# Patient Record
Sex: Male | Born: 1990 | Race: Black or African American | Hispanic: No | Marital: Single | Smoking: Current some day smoker
Health system: Southern US, Community
[De-identification: ages and names within clinical notes are randomized; demographics above are authoritative.]

---

## 2012-11-12 ENCOUNTER — Other Ambulatory Visit: Payer: Self-pay

## 2012-11-12 ENCOUNTER — Emergency Department (HOSPITAL_COMMUNITY): Payer: BC Managed Care – PPO

## 2012-11-12 ENCOUNTER — Emergency Department (HOSPITAL_COMMUNITY)
Admission: EM | Admit: 2012-11-12 | Discharge: 2012-11-13 | Disposition: A | Discharge status: Home / Self Care | Payer: BC Managed Care – PPO | Attending: Emergency Medicine | Admitting: Emergency Medicine

## 2012-11-12 DIAGNOSIS — I10 Essential (primary) hypertension: Secondary | ICD-10-CM | POA: Insufficient documentation

## 2012-11-12 DIAGNOSIS — Z792 Long term (current) use of antibiotics: Secondary | ICD-10-CM | POA: Insufficient documentation

## 2012-11-12 DIAGNOSIS — R42 Dizziness and giddiness: Secondary | ICD-10-CM | POA: Insufficient documentation

## 2012-11-12 DIAGNOSIS — R319 Hematuria, unspecified: Secondary | ICD-10-CM | POA: Insufficient documentation

## 2012-11-12 LAB — BASIC METABOLIC PANEL
BUN: 10 mg/dL (ref 6–23)
Calcium: 9 mg/dL (ref 8.4–10.5)
Chloride: 104 mEq/L (ref 96–112)
Creatinine, Ser: 1.05 mg/dL (ref 0.50–1.35)
GFR calc Af Amer: >90 mL/min (ref >90)

## 2012-11-12 LAB — CBC
HCT: 37.7 % — ABNORMAL LOW (ref 39.0–52.0)
MCH: 30 pg (ref 26.0–34.0)
MCV: 86.9 fL (ref 78.0–100.0)
RDW: 13.5 % (ref 11.5–15.5)
WBC: 5.7 10*3/uL (ref 4.0–10.5)

## 2012-11-12 NOTE — ED Notes (Signed)
Per EMS: Pt reporting intermittent gradual onset of left sided CP with radiation to left arm/neck x 2 days. Pt recently dx with HTN 2 weeks ago. Per pt, HTN due to untreated strep throat infection per PCP. Pt denying CP at this time. 174/110. 100% RA. 66 NSR. Elevation noted V3, V4.

## 2012-11-12 NOTE — ED Notes (Signed)
Istat trop = 0.01.

## 2012-11-12 NOTE — ED Provider Notes (Signed)
CSN: [id number]     Arrival date & time 11/12/12  2146 History     First MD Initiated Contact with Patient 11/12/12 2148     Chief Complaint  Patient presents with  . Hypertension   (Consider location/radiation/quality/duration/timing/severity/associated sxs/prior Treatment) The history is provided by the patient and medical records. No language interpreter was used.     Sean Hutchinson is a 22 y.o. male  with a hx of post strep glomerulonephritis presents to the Emergency Department complaining of HTN measured 187/111 earlier in the evening with associated lightheadedness for about 1 hour, but admits that he was having a panic attack at the time.  Pt with complete resolution of symptoms by the time EMS arrived on scene. Pt states he had strep throat about 4 weeks ago for which he was never treated.  He then developed HTN and swelling in all extremities.  Evaluation of the symptoms by his primary care physician showed protein and blood in the urine as well as increased BUN and creatinine.  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 He is also seen a cardiologist for his resulting hypertension and was started on bisystolic several weeks ago. Patient states she's not been taking it regularly because he thought he didn't need it. Patient states he resumed his medication regimen 2 days ago but has not taken today's dose. Patient without associated symptoms at this time. Nothing makes it better or worse. He denies fever, chills, headache, neck pain chest pain shortness of breath, abdominal pain nausea, vomiting, diarrhea, weakness, dizziness, syncope, dysuria, hematuria.  PCP: Dr Tania Ade - in [facility]  No past medical history on file. No past surgical history on file. No family history on file. History  Substance Use Topics  . Smoking status: Not on file  . Smokeless tobacco: Not on file  . Alcohol Use: Not on file    Review of Systems  Constitutional: Negative for  fever, diaphoresis, appetite change, fatigue and unexpected weight change.  HENT: Negative for mouth sores and neck stiffness.   Eyes: Negative for visual disturbance.  Respiratory: Negative for cough, chest tightness, shortness of breath and wheezing.   Cardiovascular: Negative for chest pain.  Gastrointestinal: Negative for nausea, vomiting, abdominal pain, diarrhea and constipation.  Endocrine: Negative for polydipsia, polyphagia and polyuria.  Genitourinary: Negative for dysuria, urgency, frequency and hematuria.  Musculoskeletal: Negative for back pain.  Skin: Negative for rash.  Allergic/Immunologic: Negative for immunocompromised state.  Neurological: Positive for light-headedness. Negative for syncope and headaches.  Hematological: Does not bruise/bleed easily.  Psychiatric/Behavioral: Negative for sleep disturbance. The patient is not nervous/anxious.     Allergies  Review of patient's allergies indicates not on file.  Home Medications   Current Outpatient Rx  Name  Route  Sig  Dispense  Refill  . ciprofloxacin (CIPRO) 500 MG tablet   Oral   Take 500 mg by mouth once.          BP 163/98  Pulse 63  Temp(Src) 98.5 F (36.9 C) (Oral)  Resp 12  SpO2 100% Physical Exam  Nursing note and vitals reviewed. Constitutional: He is oriented to person, place, and time. He appears well-developed and well-nourished. No distress.  Awake, alert, nontoxic appearance  HENT:  Head: Normocephalic and atraumatic.  Right Ear: External ear normal.  Left Ear: External ear normal.  Mouth/Throat: Oropharynx is clear and moist. No oropharyngeal exudate.  Eyes: Conjunctivae and EOM are normal. Pupils are equal, round, and reactive to light. No  scleral icterus.  Neck: Normal range of motion. Neck supple.  Cardiovascular: Normal rate, regular rhythm, S1 normal, S2 normal, normal heart sounds and intact distal pulses.   No murmur heard. Pulses:      Radial pulses are 2+ on the right side,  and 2+ on the left side.       Dorsalis pedis pulses are 2+ on the right side, and 2+ on the left side.       Posterior tibial pulses are 2+ on the right side, and 2+ on the left side.  Capillary refill < 3 sec  Pulmonary/Chest: Effort normal and breath sounds normal. No respiratory distress. He has no wheezes. He has no rales. He exhibits no tenderness.  Abdominal: Soft. Bowel sounds are normal. He exhibits no distension and no mass. There is no tenderness. There is no rebound and no guarding.  No CVA tenderness  Musculoskeletal: Normal range of motion. He exhibits no edema and no tenderness.  Lymphadenopathy:    He has no cervical adenopathy.  Neurological: He is alert and oriented to person, place, and time. He exhibits normal muscle tone. Coordination normal.  Speech is clear and goal oriented Moves extremities without ataxia  Skin: Skin is warm and dry. No rash noted. He is not diaphoretic. No erythema.  Psychiatric: He has a normal mood and affect.    ED Course   Procedures (including critical care time)  Labs Reviewed  CBC - Abnormal; Notable for the following:    HCT 37.7 (*)    All other components within normal limits  URINALYSIS, ROUTINE W REFLEX MICROSCOPIC - Abnormal; Notable for the following:    Hgb urine dipstick SMALL (*)    All other components within normal limits  BASIC METABOLIC PANEL  URINE MICROSCOPIC-ADD ON   Date: 11/13/2012  Rate: 61  Rhythm: normal sinus rhythm  QRS Axis: right  Intervals: normal  ST/T Wave abnormalities: normal  Conduction Disutrbances:Incomplete right bundle-branch block  Narrative Interpretation: Incomplete right bundle-branch block. No prior ECG available for comparison.  Old EKG Reviewed: none available  Dg Chest 2 View  11/12/2012   *RADIOLOGY REPORT*  Clinical Data: Chest pain  CHEST - 2 VIEW  Comparison: None.  Findings: Cardiomediastinal silhouette is within normal limits. The lungs are clear. No pleural effusion.  No  pneumothorax.  No acute osseous abnormality.  IMPRESSION: Normal chest.   Original Report Authenticated By: Christiana Pellant, M.D.   1. HTN (hypertension)   2. Painless hematuria     MDM  Ronan Duecker presents for evaluation of asymptomatic HTN and anxiety.  Pt has been noncompliant with his medication for the last several weeks.  Patient noted to be hypertensive in the emergency department.  No signs of hypertensive urgency.  CBC, BMP unremarkable. Chest x-ray without evidence of pleural effusions or pneumothorax. Troponin negative. ECG is noted right bundle branch block no old for comparison but no acute ischemia noted. I highly doubt ACS patient has negative troponin is without chest pain, shortness of breath, diaphoresis or other associated symptoms. UA without routine but with small amount of hemoglobin. Discussed with patient the need for close follow-up and management by their primary care physician.   I have also discussed reasons to return immediately to the ER.  Patient expresses understanding and agrees with plan.     Dahlia Client Jago Carton, PA-C 11/13/12 [phone]

## 2012-11-12 NOTE — ED Notes (Signed)
Pt states he had strep throat 4 weeks ago and was not treated for it. Pt states he has been seeing his primary care doctor and a cardiologist as his BP has been high since he noticed he had strep throat and did not treat it. Pt states his doctor is concerned with his kidney function and his high BP. Pt states he felt lightheaded and dizzy earlier today while experiencing chest pain. Pt states he called EMS at this time.

## 2012-11-13 LAB — URINALYSIS, ROUTINE W REFLEX MICROSCOPIC
Bilirubin Urine: NEGATIVE
Glucose, UA: NEGATIVE mg/dL
Ketones, ur: NEGATIVE mg/dL
Nitrite: NEGATIVE
Protein, ur: NEGATIVE mg/dL
pH: 7 (ref 5.0–8.0)

## 2012-11-13 LAB — POCT I-STAT TROPONIN I: Troponin i, poc: 0.01 ng/mL (ref 0.00–0.08)

## 2012-11-13 LAB — URINE MICROSCOPIC-ADD ON

## 2012-11-13 NOTE — ED Provider Notes (Signed)
Date: 11/13/2012  Rate: 61  Rhythm: normal sinus rhythm  QRS Axis: right  Intervals: normal  ST/T Wave abnormalities: normal  Conduction Disutrbances:Incomplete right bundle-branch block  Narrative Interpretation: Right axis deviation, incomplete right bundle-branch block. No prior ECG available for comparison.  Old EKG Reviewed: none available  Medical screening examination/treatment/procedure(s) were performed by non-physician practitioner and as supervising physician I was immediately available for consultation/collaboration.   Dione Booze, MD 11/13/12 [phone]

## 2012-11-13 NOTE — ED Provider Notes (Signed)
Medical screening examination/treatment/procedure(s) were performed by non-physician practitioner and as supervising physician I was immediately available for consultation/collaboration.    Christopher J. Pollina, MD 11/13/12 1545 

## 2012-12-19 ENCOUNTER — Other Ambulatory Visit: Payer: Self-pay | Admitting: Nephrology

## 2012-12-24 ENCOUNTER — Other Ambulatory Visit: Payer: BC Managed Care – PPO

## 2012-12-26 ENCOUNTER — Other Ambulatory Visit: Payer: BC Managed Care – PPO

## 2013-01-03 ENCOUNTER — Ambulatory Visit
Admission: RE | Admit: 2013-01-03 | Discharge: 2013-01-03 | Disposition: A | Discharge status: Home / Self Care | Payer: BC Managed Care – PPO | Source: Ambulatory Visit | Attending: Nephrology | Admitting: Nephrology

## 2014-01-29 IMAGING — CR DG CHEST 2V
2 series · 2 of 2 positions shown · non-contrast
Comparison: None.

CLINICAL DATA: Chest pain

CHEST - 2 VIEW

[w chest pa]
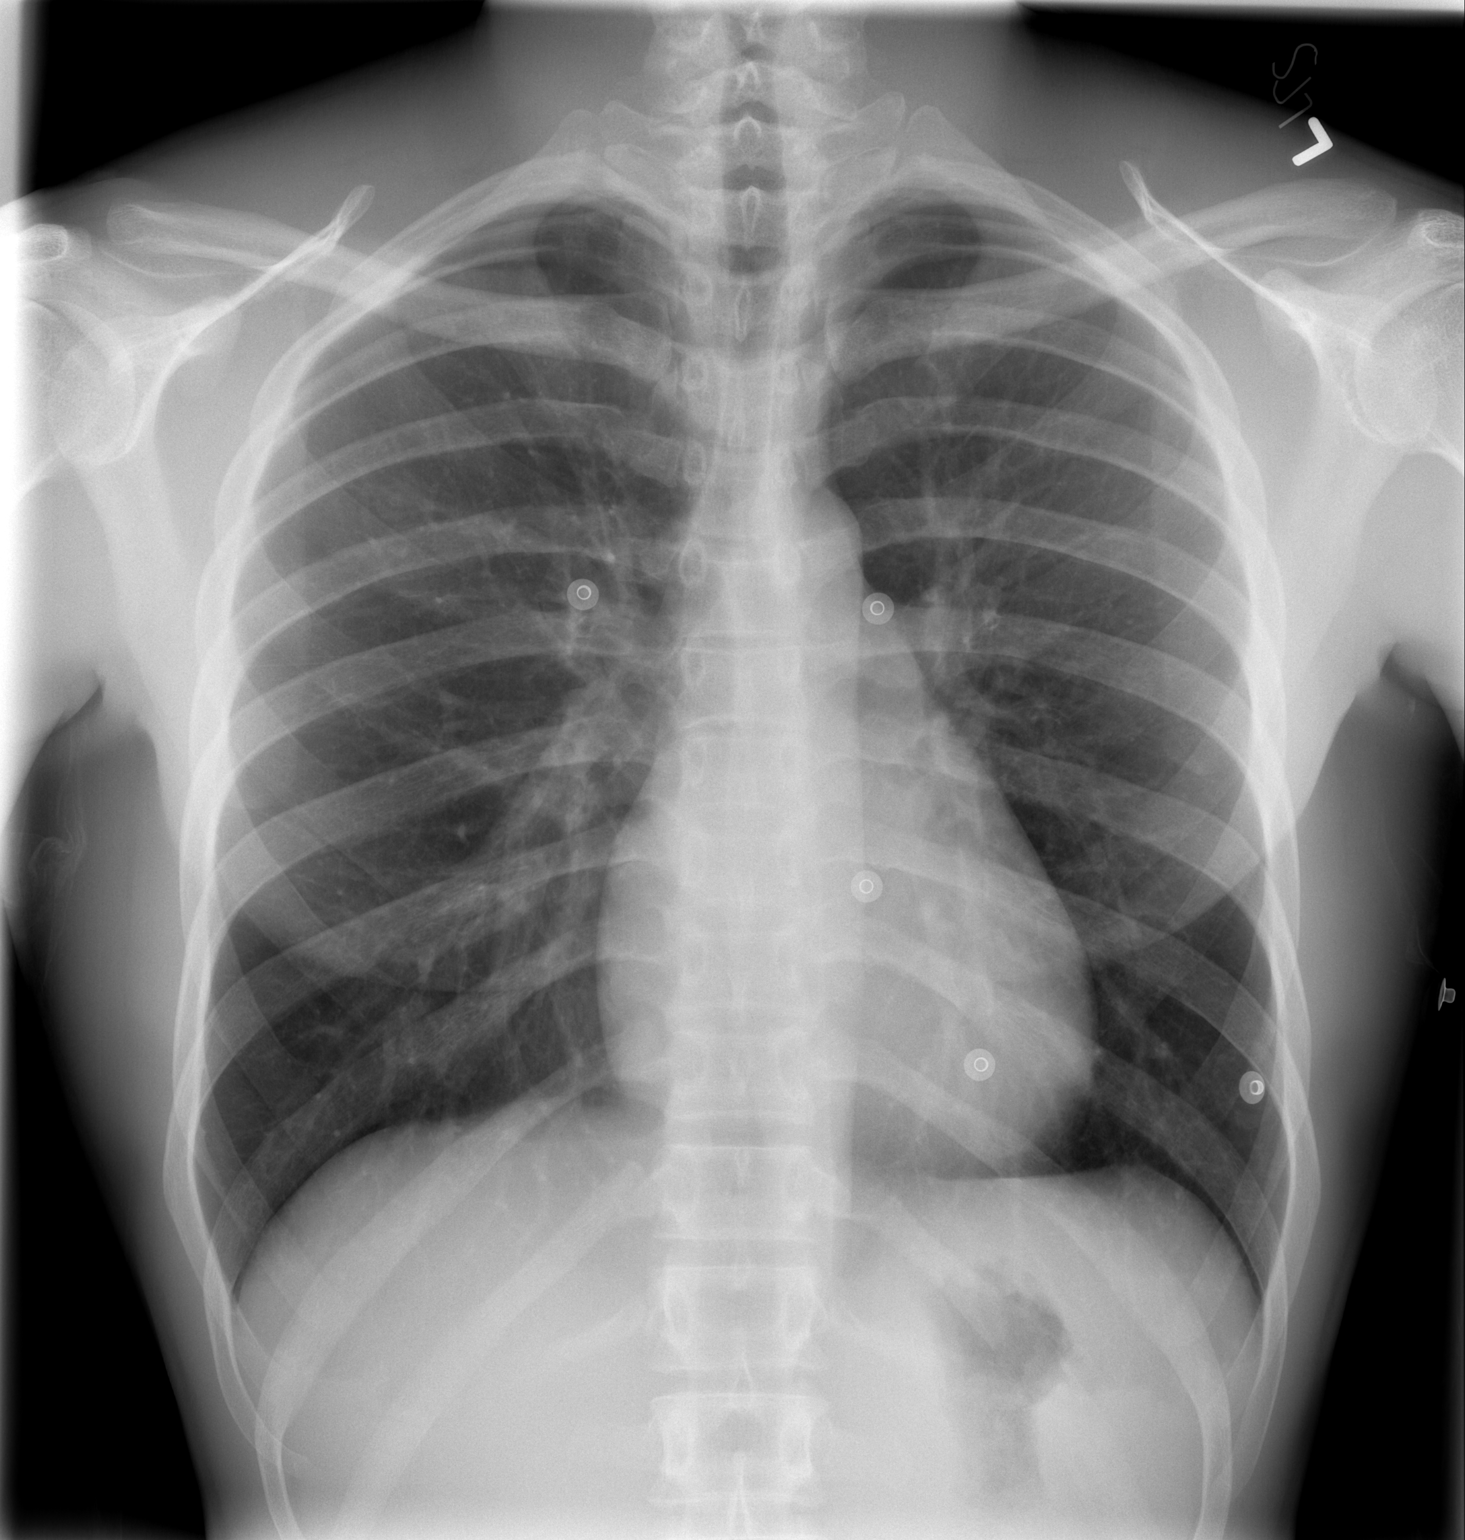

[w chest lat]
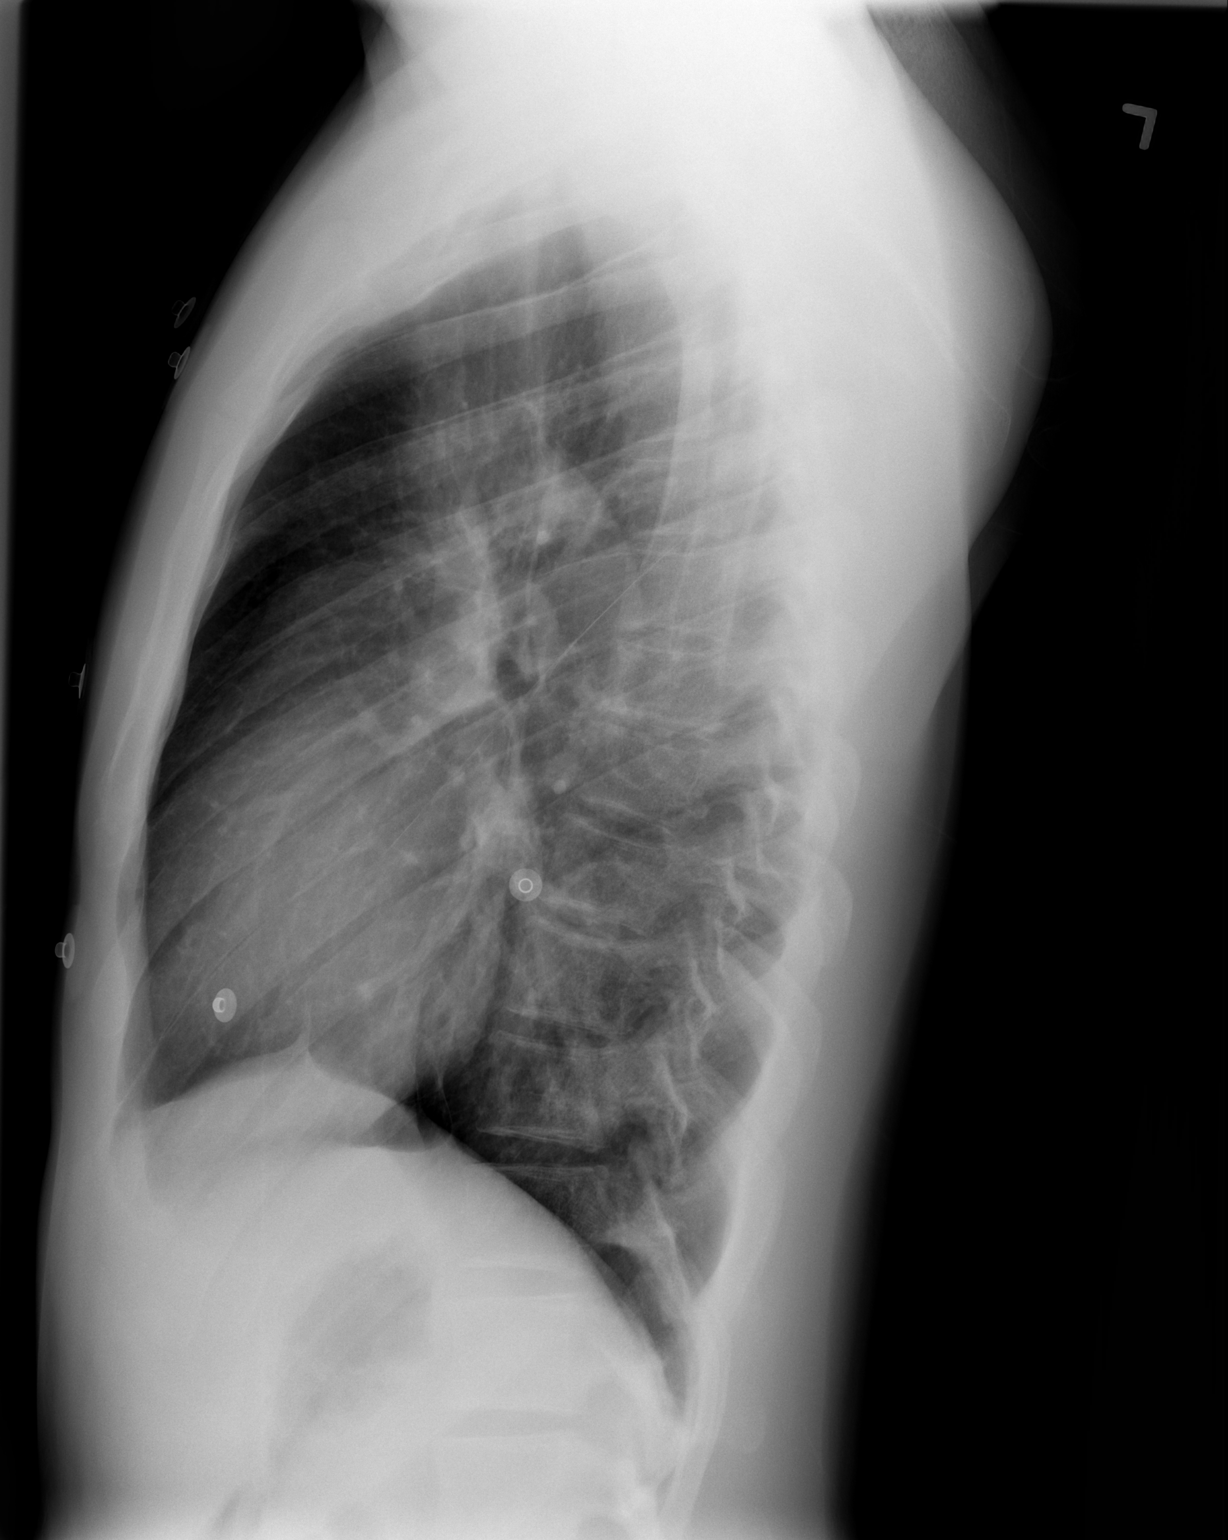

[2 of 2 positions shown; findings below may reference images not displayed]

FINDINGS: Cardiomediastinal silhouette is within normal limits. The
lungs are clear. No pleural effusion.  No pneumothorax.  No acute
osseous abnormality.
IMPRESSION: Normal chest.

## 2014-03-22 IMAGING — US US RENAL
1 series · 14 of 25 positions shown · non-contrast
Comparison: None.

CLINICAL DATA: Chronic kidney disease.

RENAL/URINARY TRACT ULTRASOUND COMPLETE

[Series 1: us renal · 0.24mm/px · 14 of 33 slices shown]
[im 1/33]
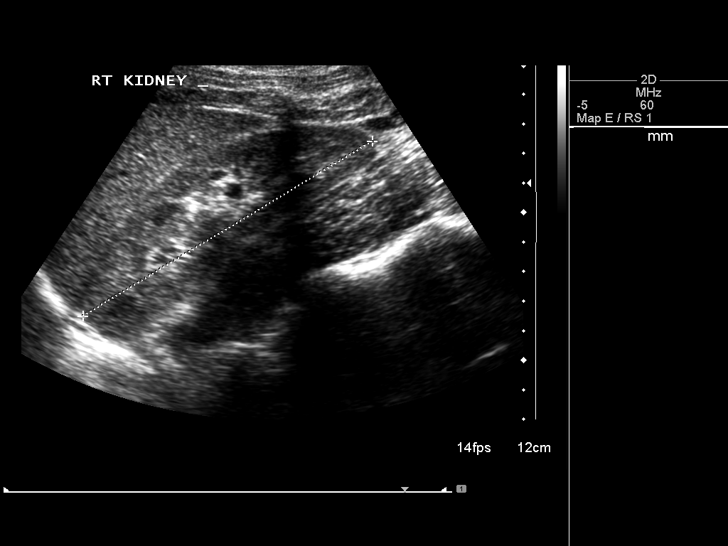
[im 3/33]
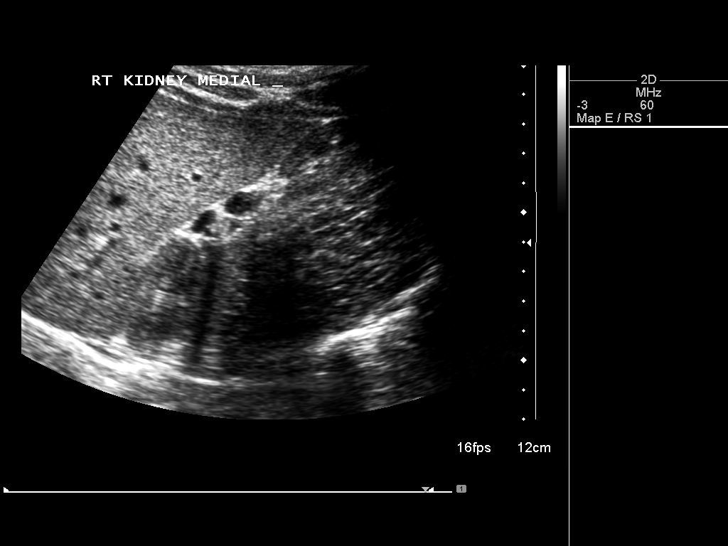
[im 6/33]
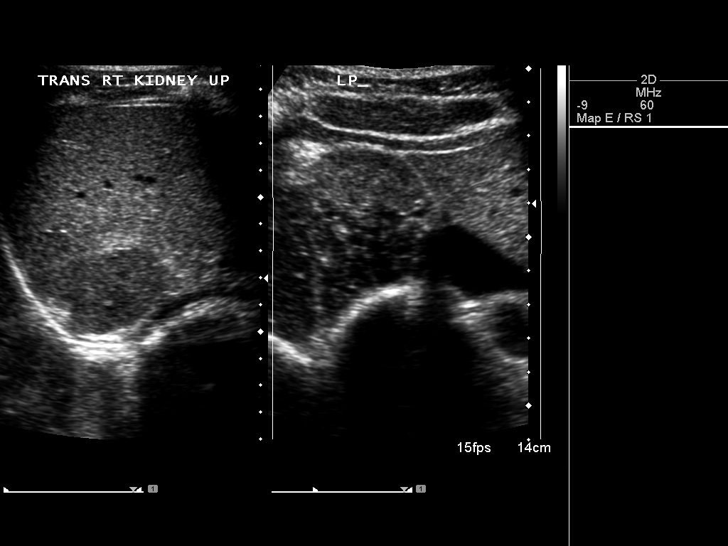
[im 9/33]
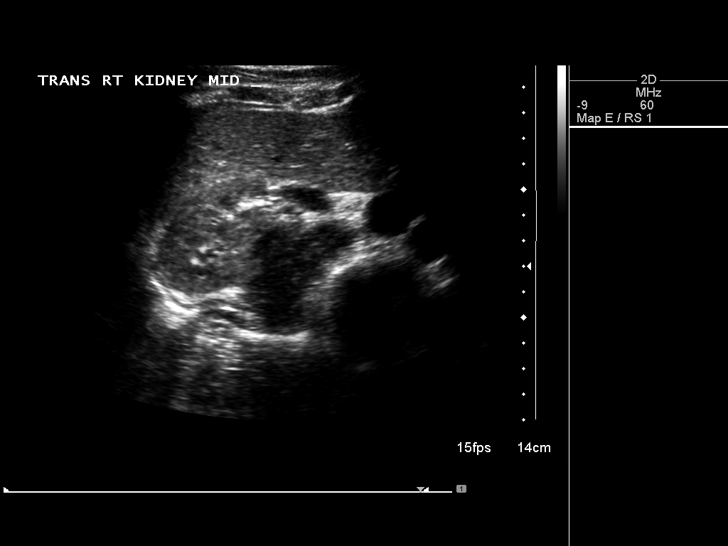
[im 11/33]
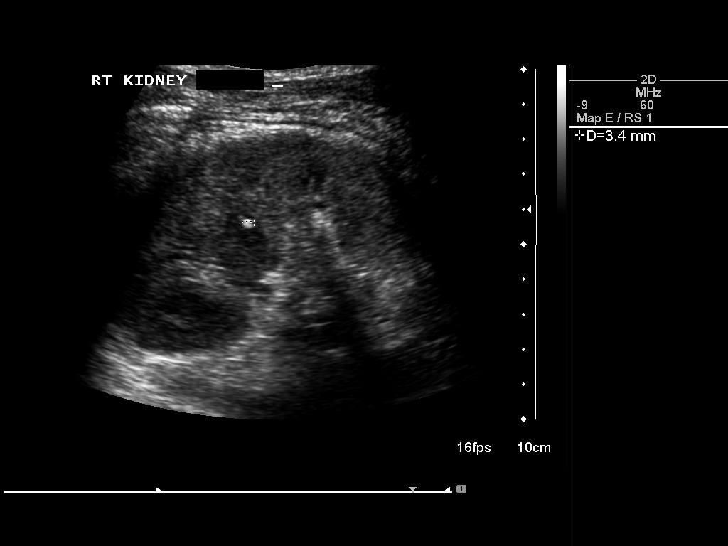
[im 13/33]
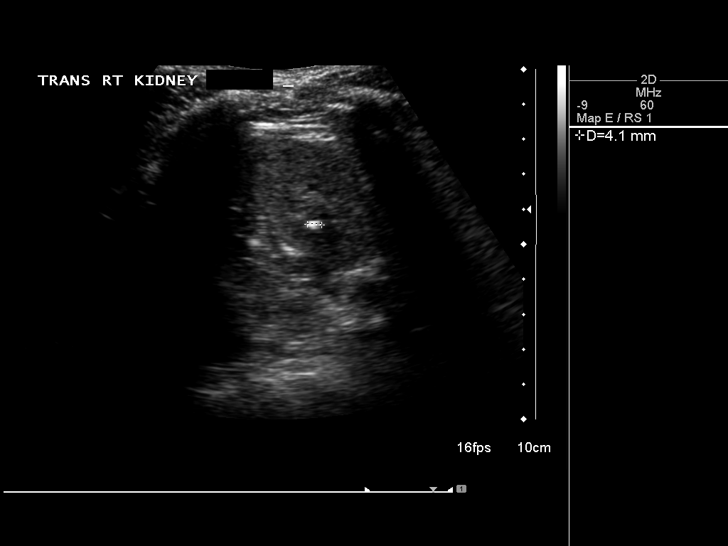
[im 15/33]
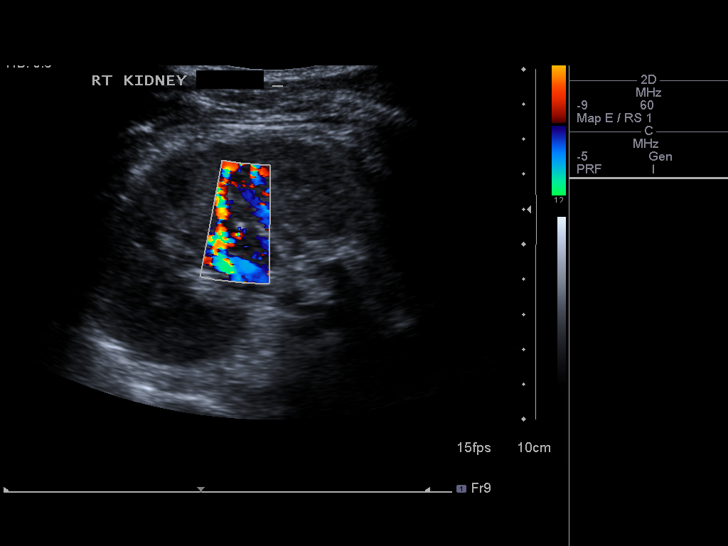
[im 18/33]
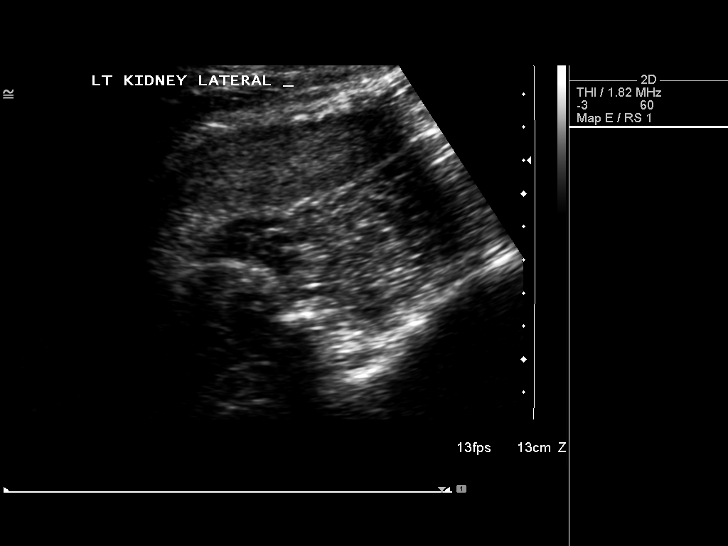
[im 21/33]
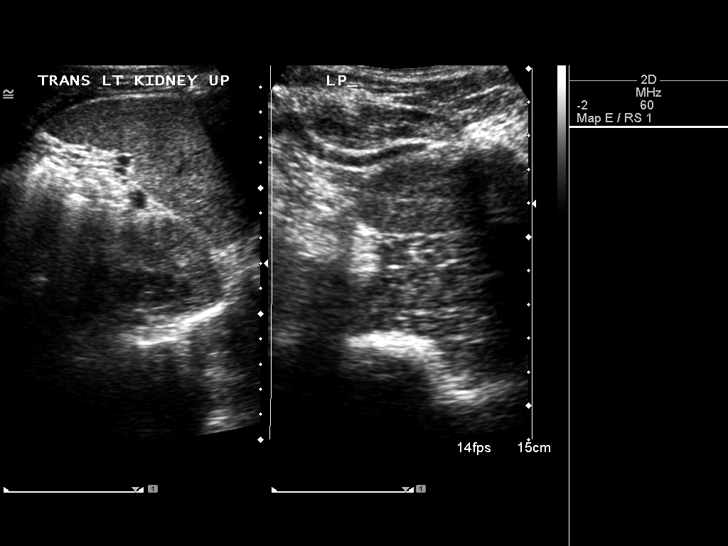
[im 22/33]
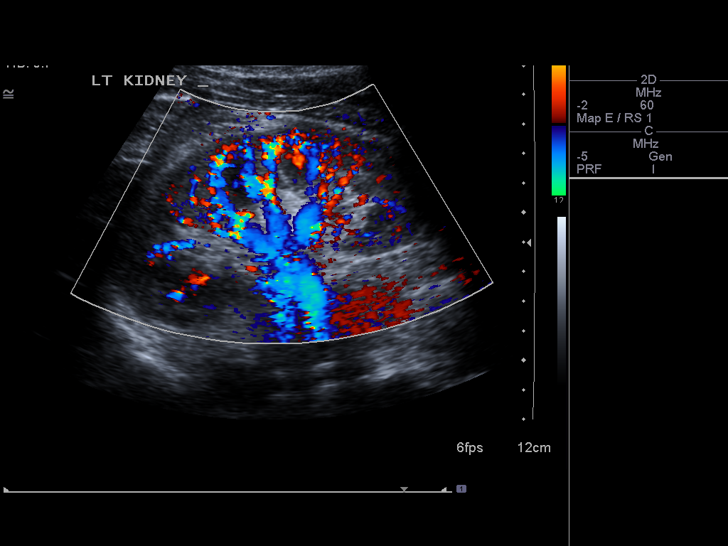
[im 25/33]
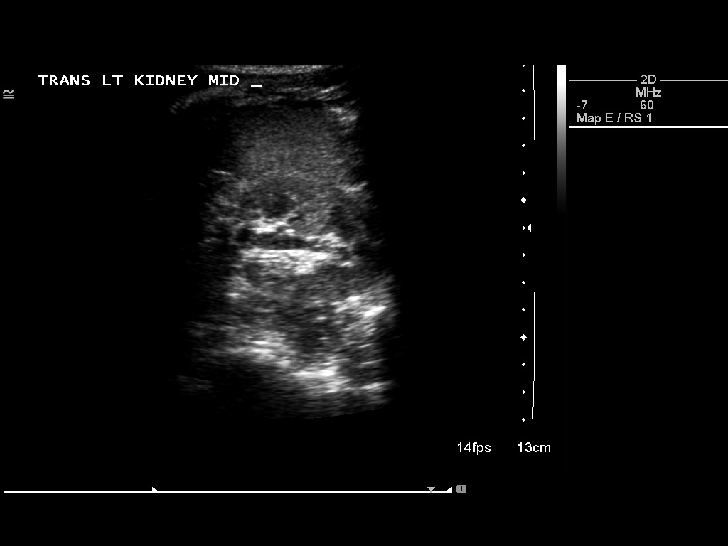
[im 27/33]
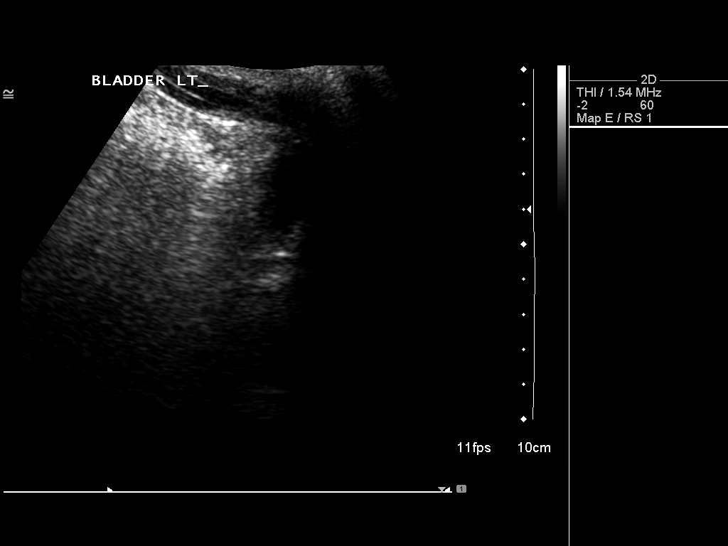
[im 30/33]
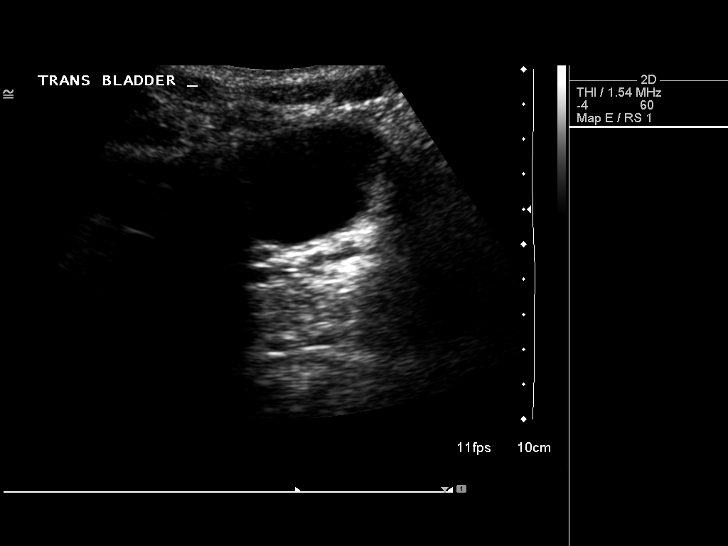
[im 33/33]
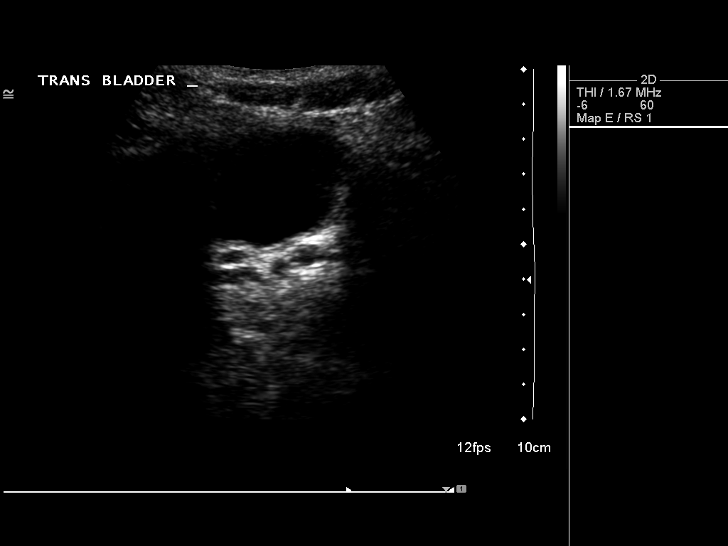

[14 of 25 positions shown; findings below may reference images not displayed]

FINDINGS: Right Kidney:  Measures 11.4 cm in length. 3 mm nonobstructive
calculus is seen in the mid to upper pole of right kidney. Normal
in size and parenchymal echogenicity.  No evidence of mass or
hydronephrosis.

Left Kidney:  Measures 11.7 cm in length. Normal in size and
parenchymal echogenicity.  No evidence of mass or hydronephrosis.

Bladder:  Appears normal for degree of bladder distention.
IMPRESSION: Small nonobstructive calculus seen in the right kidney.  No other
renal abnormality seen.

## 2015-03-28 ENCOUNTER — Encounter (HOSPITAL_COMMUNITY): Payer: Self-pay | Admitting: Emergency Medicine

## 2015-03-28 ENCOUNTER — Emergency Department (HOSPITAL_COMMUNITY)
Admission: EM | Admit: 2015-03-28 | Discharge: 2015-03-28 | Disposition: A | Discharge status: Home / Self Care | Payer: Self-pay | Attending: Emergency Medicine | Admitting: Emergency Medicine

## 2015-03-28 DIAGNOSIS — J028 Acute pharyngitis due to other specified organisms: Secondary | ICD-10-CM

## 2015-03-28 DIAGNOSIS — F17210 Nicotine dependence, cigarettes, uncomplicated: Secondary | ICD-10-CM | POA: Insufficient documentation

## 2015-03-28 DIAGNOSIS — J029 Acute pharyngitis, unspecified: Secondary | ICD-10-CM | POA: Insufficient documentation

## 2015-03-28 DIAGNOSIS — J309 Allergic rhinitis, unspecified: Secondary | ICD-10-CM | POA: Insufficient documentation

## 2015-03-28 DIAGNOSIS — B9789 Other viral agents as the cause of diseases classified elsewhere: Secondary | ICD-10-CM

## 2015-03-28 LAB — RAPID STREP SCREEN (MED CTR MEBANE ONLY): STREPTOCOCCUS, GROUP A SCREEN (DIRECT): NEGATIVE

## 2015-03-28 MED ORDER — FLUTICASONE PROPIONATE 50 MCG/ACT NA SUSP: 2.0000 | Freq: Every day | NASAL | Status: AC

## 2015-03-28 MED ORDER — NAPROXEN 250 MG PO TABS: 250.0000 mg | ORAL_TABLET | Freq: Two times a day (BID) | ORAL | Status: AC

## 2015-03-28 MED ORDER — CETIRIZINE HCL 10 MG PO TABS: 10.0000 mg | ORAL_TABLET | Freq: Every day | ORAL | Status: AC

## 2015-03-28 MED ORDER — ACETAMINOPHEN 325 MG PO TABS
650.0000 mg | ORAL_TABLET | Freq: Once | ORAL | Status: AC
  Administered 2015-03-28: 650 mg via ORAL
  Filled 2015-03-28: qty 2

## 2015-03-28 NOTE — ED Notes (Signed)
See pa note.  

## 2015-03-28 NOTE — ED Provider Notes (Signed)
CSN: [id number]     Arrival date & time 03/28/15  1954 History  By signing my name below, I, Sean Hutchinson, attest that this documentation has been prepared under the direction and in the presence of Sean FarrierWilliam Sarahanne Novakowski, PA-C. Electronically Signed: Evon Slackerrance Hutchinson, ED Scribe. 03/28/2015. 8:58 PM.    Chief Complaint  Patient presents with  . Sore Throat  . Nasal Congestion   Patient is a 24 y.o. male presenting with pharyngitis. The history is provided by the patient. No language interpreter was used.  Sore Throat Pertinent negatives include no abdominal pain, no headaches and no shortness of breath.   HPI Comments: Sean Hutchinson is a 24 y.o. male who presents to the Emergency Department complaining of sore throat onset 3 days prior. Pt rates the severity of his pain 5/10. Pt reports associated ear pain, rhinorrhea and post nasal drip. Pt states that he has tried cough drops with no relief. He reports similar pain with previous strep throat.  Denies fever, chills, coughing, wheezing, shortness of breath, sneezing, abdominal pain, nausea vomiting drooling or trouble swallowing   History reviewed. No pertinent past medical history. History reviewed. No pertinent past surgical history. No family history on file. Social History  Substance Use Topics  . Smoking status: Current Some Day Smoker    Types: Cigars  . Smokeless tobacco: None  . Alcohol Use: Yes    Review of Systems  Constitutional: Negative for fever and chills.  HENT: Positive for ear pain, postnasal drip, rhinorrhea and sore throat. Negative for drooling, sinus pressure, sneezing and trouble swallowing.   Eyes: Negative for pain, discharge, itching and visual disturbance.  Respiratory: Negative for cough, shortness of breath and wheezing.   Gastrointestinal: Negative for nausea, vomiting and abdominal pain.  Skin: Negative for rash.  Neurological: Negative for light-headedness and headaches.      Allergies  Review of  patient's allergies indicates no known allergies.  Home Medications   Prior to Admission medications   Medication Sig Start Date End Date Taking? Authorizing Provider  cetirizine (ZYRTEC ALLERGY) 10 MG tablet Take 1 tablet (10 mg total) by mouth daily. 03/28/15   Sean FarrierWilliam Esgar Barnick, PA-C  ciprofloxacin (CIPRO) 500 MG tablet Take 500 mg by mouth once. 11/07/12   Historical Provider, MD  fluticasone (FLONASE) 50 MCG/ACT nasal spray Place 2 sprays into both nostrils daily. 03/28/15   Sean FarrierWilliam Talton Delpriore, PA-C  naproxen (NAPROSYN) 250 MG tablet Take 1 tablet (250 mg total) by mouth 2 (two) times daily with a meal. 03/28/15   Sean FarrierWilliam Aniesha Haughn, PA-C   BP 144/81 mmHg  Pulse 66  Temp(Src) 97.7 F (36.5 C) (Oral)  Resp 16  Ht 6\' 1"  (1.854 m)  Wt 71.215 kg  BMI 20.72 kg/m2  SpO2 98%   Physical Exam  Constitutional: He is oriented to person, place, and time. He appears well-developed and well-nourished. No distress.  Nontoxic appearing.  HENT:  Head: Normocephalic and atraumatic.  Right Ear: Tympanic membrane and external ear normal.  Left Ear: Tympanic membrane and external ear normal.  Nose: Right sinus exhibits no maxillary sinus tenderness and no frontal sinus tenderness. Left sinus exhibits no maxillary sinus tenderness and no frontal sinus tenderness.  Mouth/Throat: Uvula is midline. No oropharyngeal exudate, posterior oropharyngeal edema or posterior oropharyngeal erythema.  Boggy nasal turbinates bilaterally. Tonsils surgically absent. No exudates. Uvula is midline without edema. No peritonsillar abscess. No trismus. No drooling. No frontal or maxillary sinus tenderness to palpation.  Bilateral tympanic membranes are pearly-gray without erythema or  loss of landmarks.   Eyes: Conjunctivae and EOM are normal. Pupils are equal, round, and reactive to light. Right eye exhibits no discharge. Left eye exhibits no discharge.  Neck: Normal range of motion. Neck supple. No JVD present. No tracheal  deviation present.  Cardiovascular: Normal rate, regular rhythm, normal heart sounds and intact distal pulses.   Pulmonary/Chest: Effort normal and breath sounds normal. No respiratory distress. He has no wheezes. He has no rales.  Clear to auscultation.   Abdominal: Soft. There is no tenderness.  Lymphadenopathy:    He has no cervical adenopathy.  Neurological: He is alert and oriented to person, place, and time. Coordination normal.  Skin: Skin is warm and dry. No rash noted. He is not diaphoretic. No erythema. No pallor.  Psychiatric: He has a normal mood and affect. His behavior is normal.  Nursing note and vitals reviewed.   ED Course  Procedures (including critical care time) DIAGNOSTIC STUDIES: Oxygen Saturation is 98% on RA, normal by my interpretation.    COORDINATION OF CARE: 8:03 PM-Discussed treatment plan with pt at bedside and pt agreed to plan.     Labs Review Labs Reviewed  RAPID STREP SCREEN (NOT AT [facility])  CULTURE, GROUP A STREP    Imaging Review No results found.    EKG Interpretation None      Filed Vitals:   03/28/15 2000 03/28/15 2002  BP: 144/81   Pulse: 66   Temp:  97.7 F (36.5 C)  TempSrc: Oral Oral  Resp: 16   Height:  (1.854 m)   Weight: 71.215 kg   SpO2: 98%      MDM   Meds given in ED:  Medications  acetaminophen (TYLENOL) tablet 650 mg (650 mg Oral Given 03/28/15 2055)    New Prescriptions   CETIRIZINE (ZYRTEC ALLERGY) 10 MG TABLET    Take 1 tablet (10 mg total) by mouth daily.   FLUTICASONE (FLONASE) 50 MCG/ACT NASAL SPRAY    Place 2 sprays into both nostrils daily.   NAPROXEN (NAPROSYN) 250 MG TABLET    Take 1 tablet (250 mg total) by mouth 2 (two) times daily with a meal.    Final diagnoses:  Allergic rhinitis, unspecified allergic rhinitis type  Sore throat (viral)    This is a 24 y.o. male who presents to the Emergency Department complaining of sore throat onset 3 days prior. Pt rates the severity of his pain  5/10. Pt reports associated ear pain, rhinorrhea and post nasal drip. Pt states that he has tried cough drops with no relief. He denies fevers, coughing, shortness of breath, trouble swallowing, or wheezing. On exam the patient is afebrile nontoxic appearing. He is tonsils are surgically absent. No exudate. Uvula is midline without edema. No peritonsillar abscess. No drooling. His lungs are clear auscultation bilaterally.  Rapid strep is negative. Patient tolerated Tylenol by mouth without difficulty. Patient had viral sore throat and allergic rhinitis. We'll discharge with prescriptions for Zyrtec, fluticasone nasal spray and naproxen. Encouraged follow-up I's primary care provider. I advised the patient to follow-up with their primary care provider this week. I advised the patient to return to the emergency department with new or worsening symptoms or new concerns. The patient verbalized understanding and agreement with plan.     I personally performed the services described in this documentation, which was scribed in my presence. The recorded information has been reviewed and is accurate.         Sean Farrier, PA-C 03/28/15 2100  Gwyneth Sprout, MD 03/29/15 [phone]

## 2015-03-28 NOTE — ED Notes (Signed)
Pt from home for eval of sore throat, nasal congestion and drainage of mucus to back of throat. Denies any n/v/d at this time.

## 2015-03-28 NOTE — Discharge Instructions (Signed)
Allergic Rhinitis Allergic rhinitis is when the mucous membranes in the nose respond to allergens. Allergens are particles in the air that cause your body to have an allergic reaction. This causes you to release allergic antibodies. Through a chain of events, these eventually cause you to release histamine into the blood stream. Although meant to protect the body, it is this release of histamine that causes your discomfort, such as frequent sneezing, congestion, and an itchy, runny nose.  CAUSES Seasonal allergic rhinitis (hay fever) is caused by pollen allergens that may come from grasses, trees, and weeds. Year-round allergic rhinitis (perennial allergic rhinitis) is caused by allergens such as house dust mites, pet dander, and mold spores. SYMPTOMS  Nasal stuffiness (congestion).  Itchy, runny nose with sneezing and tearing of the eyes. DIAGNOSIS Your health care provider can help you determine the allergen or allergens that trigger your symptoms. If you and your health care provider are unable to determine the allergen, skin or blood testing may be used. Your health care provider will diagnose your condition after taking your health history and performing a physical exam. Your health care provider may assess you for other related conditions, such as asthma, pink eye, or an ear infection. TREATMENT Allergic rhinitis does not have a cure, but it can be controlled by:  Medicines that block allergy symptoms. These may include allergy shots, nasal sprays, and oral antihistamines.  Avoiding the allergen. Hay fever may often be treated with antihistamines in pill or nasal spray forms. Antihistamines block the effects of histamine. There are over-the-counter medicines that may help with nasal congestion and swelling around the eyes. Check with your health care provider before taking or giving this medicine. If avoiding the allergen or the medicine prescribed do not work, there are many new medicines  your health care provider can prescribe. Stronger medicine may be used if initial measures are ineffective. Desensitizing injections can be used if medicine and avoidance does not work. Desensitization is when a patient is given ongoing shots until the body becomes less sensitive to the allergen. Make sure you follow up with your health care provider if problems continue. HOME CARE INSTRUCTIONS It is not possible to completely avoid allergens, but you can reduce your symptoms by taking steps to limit your exposure to them. It helps to know exactly what you are allergic to so that you can avoid your specific triggers. SEEK MEDICAL CARE IF:  You have a fever.  You develop a cough that does not stop easily (persistent).  You have shortness of breath.  You start wheezing.  Symptoms interfere with normal daily activities.   This information is not intended to replace advice given to you by your health care provider. Make sure you discuss any questions you have with your health care provider.   Document Released: 12/27/2000 Document Revised: 04/24/2014 Document Reviewed: 12/09/2012 Elsevier Interactive Patient Education 2016 Elsevier Inc[employer] Sore Throat A sore throat is pain, burning, irritation, or scratchiness of the throat. There is often pain or tenderness when swallowing or talking. A sore throat may be accompanied by other symptoms, such as coughing, sneezing, fever, and swollen neck glands. A sore throat is often the first sign of another sickness, such as a cold, flu, strep throat, or mononucleosis (commonly known as mono). Most sore throats go away without medical treatment. CAUSES  The most common causes of a sore throat include:  A viral infection, such as a cold, flu, or mono.  A bacterial infection, such as strep  throat, tonsillitis, or whooping cough.  Seasonal allergies.  Dryness in the air.  Irritants, such as smoke or pollution.  Gastroesophageal reflux disease  (GERD). HOME CARE INSTRUCTIONS   Only take over-the-counter medicines as directed by your caregiver.  Drink enough fluids to keep your urine clear or pale yellow.  Rest as needed.  Try using throat sprays, lozenges, or sucking on hard candy to ease any pain (if older than 4 years or as directed).  Sip warm liquids, such as broth, herbal tea, or warm water with honey to relieve pain temporarily. You may also eat or drink cold or frozen liquids such as frozen ice pops.  Gargle with salt water (mix 1 tsp salt with 8 oz of water).  Do not smoke and avoid secondhand smoke.  Put a cool-mist humidifier in your bedroom at night to moisten the air. You can also turn on a hot shower and sit in the bathroom with the door closed for 5-10 minutes. SEEK IMMEDIATE MEDICAL CARE IF:  You have difficulty breathing.  You are unable to swallow fluids, soft foods, or your saliva.  You have increased swelling in the throat.  Your sore throat does not get better in 7 days.  You have nausea and vomiting.  You have a fever or persistent symptoms for more than 2-3 days.  You have a fever and your symptoms suddenly get worse. MAKE SURE YOU:   Understand these instructions.  Will watch your condition.  Will get help right away if you are not doing well or get worse.   This information is not intended to replace advice given to you by your health care provider. Make sure you discuss any questions you have with your health care provider.   Document Released: 05/11/2004 Document Revised: 04/24/2014 Document Reviewed: 12/10/2011 Elsevier Interactive Patient Education [employer] Else[employer] Inc.

## 2015-03-31 LAB — CULTURE, GROUP A STREP: STREP A CULTURE: POSITIVE — AB

## 2015-04-01 ENCOUNTER — Telehealth (HOSPITAL_BASED_OUTPATIENT_CLINIC_OR_DEPARTMENT_OTHER): Payer: Self-pay | Admitting: Emergency Medicine

## 2015-04-01 NOTE — Telephone Encounter (Signed)
Post ED Visit - Positive Culture Follow-up: Successful Patient Follow-Up  Culture assessed and recommendations reviewed by: [x]  Enzo BiNathan Batchelder, Pharm.D. []  Celedonio MiyamotoJeremy Frens, Pharm.D., BCPS []  Garvin FilaMike Maccia, Pharm.D. []  Georgina PillionElizabeth Martin, Pharm.D., BCPS []  CushingMinh Pham, [address] BoulevardPharm.D., BCPS, AAHIVP []  Estella HuskMichelle Turner, Pharm.D., BCPS, AAHIVP []  Tennis Mustassie Stewart, Pharm.D. []  Sherle Poeob Vincent, [address]Pharm[address]  Positive strep culture  [x]  Patient discharged without antimicrobial prescription and treatment is now indicated []  Organism is resistant to prescribed ED discharge antimicrobial []  Patient with positive blood cultures  Changes discussed with ED provider: Arthor CaptainAbigail Harris PA New antibiotic prescription start Amoxicillin 500mg  po bid x 10 days  Attempting to reach patient    Berle MullMiller, Nikalas Bramel 04/01/2015, 11:18 AM

## 2015-04-01 NOTE — Progress Notes (Signed)
ED Antimicrobial Stewardship Positive Culture Follow Up   Sean Hutchinson is an 24 y.o. male who presented to [facility] on 03/28/2015 with a chief complaint of  Chief Complaint  Patient presents with  . Sore Throat  . Nasal Congestion    Recent Results (from the past 720 hour(s))  Rapid strep screen     Status: None   Collection Time: 03/28/15  8:05 PM  Result Value Ref Range Status   Streptococcus, Group A Screen (Direct) NEGATIVE NEGATIVE Final    Comment: (NOTE) A Rapid Antigen test may result negative if the antigen level in the sample is below the detection level of this test. The FDA has not cleared this test as a stand-alone test therefore the rapid antigen negative result has reflexed to a Group A Strep culture.   Culture, Group A Strep     Status: Abnormal   Collection Time: 03/28/15  8:05 PM  Result Value Ref Range Status   Strep A Culture Positive (A)  Final    Comment: (NOTE) Penicillin and ampicillin are drugs of choice for treatment of beta-hemolytic streptococcal infections. Susceptibility testing of penicillins and other beta-lactam agents approved by the FDA for treatment of beta-hemolytic streptococcal infections need not be performed routinely because nonsusceptible isolates are extremely rare in any beta-hemolytic streptococcus and have not been reported for Streptococcus pyogenes (group A). (CLSI 2011) Performed At: [facility] [address] [place], [place] [id number] Mila HomerHancock William F MD BM:[id number]:[phone]     [x]  Patient discharged originally without antimicrobial agent and treatment is now indicated  New antibiotic prescription: Amoxicillin 500mg  PO BID x 10 days  ED Provider: Arthor CaptainAbigail Harris PA-C  Armandina StammerBATCHELDER,[name] 04/01/2015, 9:04 AM Infectious Diseases Pharmacist Phone# [phone]

## 2015-04-03 ENCOUNTER — Telehealth (HOSPITAL_COMMUNITY): Payer: Self-pay

## 2015-04-03 NOTE — Telephone Encounter (Signed)
Unable to reach by telephone. Letter sent to address on record.  

## 2015-04-20 ENCOUNTER — Telehealth (HOSPITAL_COMMUNITY): Payer: Self-pay

## 2015-04-20 NOTE — Telephone Encounter (Signed)
Unable to reach by phone or mail.  Chart closed.   

## 2021-10-30 ENCOUNTER — Emergency Department: Admit: 2021-10-30

## 2021-10-30 ENCOUNTER — Inpatient Hospital Stay
Admit: 2021-10-30 | Discharge: 2021-11-03 | Disposition: A | Discharge status: Home / Self Care | Source: Other Acute Inpatient Hospital | Attending: Internal Medicine | Admitting: Internal Medicine

## 2021-10-30 ENCOUNTER — Inpatient Hospital Stay
Admission: EM | Admit: 2021-10-30 | Discharge: 2021-10-30 | Disposition: A | Discharge status: Still In Hospital | Admitting: Internal Medicine

## 2021-10-30 DIAGNOSIS — I609 Nontraumatic subarachnoid hemorrhage, unspecified: Principal | ICD-10-CM

## 2021-10-30 DIAGNOSIS — I619 Nontraumatic intracerebral hemorrhage, unspecified: Secondary | ICD-10-CM

## 2021-10-30 DIAGNOSIS — S066XAA Traumatic subarachnoid hemorrhage with loss of consciousness status unknown, initial encounter (HCC): Principal | ICD-10-CM

## 2021-10-30 LAB — URINALYSIS
Bilirubin Urine: NEGATIVE
Blood, Urine: NEGATIVE
Glucose, UA: NEGATIVE mg/dL
Ketones, Urine: 15 mg/dL — AB
Leukocyte Esterase, Urine: NEGATIVE
Nitrite, Urine: NEGATIVE
Protein, UA: NEGATIVE mg/dL
Specific Gravity, UA: 1.02 (ref 1.001–1.023)
Urobilinogen, Urine: 0.2 EU/dL (ref 0.2–1.0)
pH, Urine: 6 (ref 5.0–9.0)

## 2021-10-30 LAB — CBC WITH AUTO DIFFERENTIAL
Absolute Immature Granulocyte: 0.1 10*3/uL (ref 0.0–0.5)
Basophils %: 0 % (ref 0.0–2.0)
Basophils Absolute: 0 10*3/uL (ref 0.0–0.2)
Eosinophils %: 0 % — ABNORMAL LOW (ref 0.5–7.8)
Eosinophils Absolute: 0 10*3/uL (ref 0.0–0.8)
Hematocrit: 42.7 % (ref 41.1–50.3)
Hemoglobin: 14.1 g/dL (ref 13.6–17.2)
Immature Granulocytes: 0 % (ref 0.0–5.0)
Lymphocytes %: 13 % (ref 13–44)
Lymphocytes Absolute: 1.5 10*3/uL (ref 0.5–4.6)
MCH: 30.2 PG (ref 26.1–32.9)
MCHC: 33 g/dL (ref 31.4–35.0)
MCV: 91.4 FL (ref 82.0–102.0)
MPV: 9.6 FL (ref 9.4–12.3)
Monocytes %: 4 % (ref 4.0–12.0)
Monocytes Absolute: 0.5 10*3/uL (ref 0.1–1.3)
Neutrophils %: 82 % — ABNORMAL HIGH (ref 43–78)
Neutrophils Absolute: 9.8 10*3/uL — ABNORMAL HIGH (ref 1.7–8.2)
Platelets: 250 10*3/uL (ref 150–450)
RBC: 4.67 M/uL (ref 4.23–5.6)
RDW: 13 % (ref 11.9–14.6)
WBC: 11.8 10*3/uL — ABNORMAL HIGH (ref 4.3–11.1)
nRBC: 0 10*3/uL (ref 0.0–0.2)

## 2021-10-30 LAB — COMPREHENSIVE METABOLIC PANEL
ALT: 32 U/L (ref 12–65)
AST: 29 U/L (ref 15–37)
Albumin/Globulin Ratio: 1.1 (ref 0.4–1.6)
Albumin: 4.3 g/dL (ref 3.5–5.0)
Alk Phosphatase: 76 U/L (ref 50–136)
Anion Gap: 12 mmol/L — ABNORMAL HIGH (ref 2–11)
BUN: 13 MG/DL (ref 6–23)
CO2: 24 mmol/L (ref 21–32)
Calcium: 8.9 MG/DL (ref 8.3–10.4)
Chloride: 107 mmol/L (ref 101–110)
Creatinine: 1.21 MG/DL (ref 0.8–1.5)
Est, Glom Filt Rate: >60 mL/min/{1.73_m2} (ref >60)
Globulin: 4 g/dL (ref 2.8–4.5)
Glucose: 146 mg/dL — ABNORMAL HIGH (ref 65–100)
Potassium: 3.8 mmol/L (ref 3.5–5.1)
Sodium: 143 mmol/L (ref 133–143)
Total Bilirubin: 0.3 MG/DL (ref 0.2–1.1)
Total Protein: 8.3 g/dL — ABNORMAL HIGH (ref 6.3–8.2)

## 2021-10-30 LAB — EKG 12-LEAD
Atrial Rate: 55 {beats}/min
P Axis: 7 degrees
P-R Interval: 122 ms
Q-T Interval: 431 ms
QRS Duration: 117 ms
QTc Calculation (Bazett): 413 ms
R Axis: 95 degrees
T Axis: 31 degrees
Ventricular Rate: 55 {beats}/min

## 2021-10-30 LAB — URINE DRUG SCREEN
Amphetamine, Urine: NEGATIVE
Barbiturates, Urine: NEGATIVE
Benzodiazepines, Urine: NEGATIVE
Cocaine, Urine: NEGATIVE
Methadone, Urine: NEGATIVE
Opiates, Urine: NEGATIVE
PCP, Urine: NEGATIVE
THC, TH-Cannabinol, Urine: NEGATIVE

## 2021-10-30 LAB — T4, FREE: T4 Free: 1 NG/DL (ref 0.78–1.46)

## 2021-10-30 LAB — ACETAMINOPHEN LEVEL: Acetaminophen Level: <10 ug/mL — ABNORMAL LOW (ref 10.0–30)

## 2021-10-30 LAB — ETHANOL: Ethanol Lvl: 283 MG/DL

## 2021-10-30 LAB — TSH WITH REFLEX: TSH w Free Thyroid if Abnormal: 0.3 u[IU]/mL — ABNORMAL LOW (ref 0.358–3.740)

## 2021-10-30 LAB — MAGNESIUM: Magnesium: 2.1 mg/dL (ref 1.8–2.4)

## 2021-10-30 LAB — SALICYLATE LEVEL: Salicylate, Serum: 2.4 MG/DL — ABNORMAL LOW (ref 2.8–20.0)

## 2021-10-30 MED ORDER — LORAZEPAM 1 MG PO TABS
1 MG | ORAL | Status: AC | PRN
Start: 2021-10-30 — End: 2021-11-03

## 2021-10-30 MED ORDER — TRAMADOL HCL 50 MG PO TABS
50 | Freq: Four times a day (QID) | ORAL | Status: DC | PRN
Start: 2021-10-30 — End: 2021-11-03
  Administered 2021-10-30: 20:00:00 50 mg via ORAL

## 2021-10-30 MED ORDER — SODIUM CHLORIDE 0.9 % IV SOLN
0.9 % | INTRAVENOUS | Status: AC
Start: 2021-10-30 — End: 2021-10-31
  Administered 2021-10-30 – 2021-10-31 (×3): via INTRAVENOUS

## 2021-10-30 MED ORDER — ACETAMINOPHEN 650 MG RE SUPP
650 | Freq: Four times a day (QID) | RECTAL | Status: DC | PRN
Start: 2021-10-30 — End: 2021-11-03

## 2021-10-30 MED ORDER — LORAZEPAM 2 MG/ML IJ SOLN
2 MG/ML | INTRAMUSCULAR | Status: AC | PRN
Start: 2021-10-30 — End: 2021-11-03

## 2021-10-30 MED ORDER — LORAZEPAM 2 MG/ML IJ SOLN
2 MG/ML | INTRAMUSCULAR | Status: DC | PRN
Start: 2021-10-30 — End: 2021-10-30

## 2021-10-30 MED ORDER — SODIUM CHLORIDE 0.9 % IV SOLN
0.9 % | INTRAVENOUS | Status: AC | PRN
Start: 2021-10-30 — End: 2021-11-03

## 2021-10-30 MED ORDER — NORMAL SALINE FLUSH 0.9 % IV SOLN
0.9 % | Freq: Two times a day (BID) | INTRAVENOUS | Status: AC
Start: 2021-10-30 — End: 2021-11-03
  Administered 2021-10-31 – 2021-11-03 (×6): 10 mL via INTRAVENOUS

## 2021-10-30 MED ORDER — LORAZEPAM 1 MG PO TABS
1 MG | ORAL | Status: DC | PRN
Start: 2021-10-30 — End: 2021-10-30

## 2021-10-30 MED ORDER — POTASSIUM CHLORIDE 10 MEQ/100ML IV SOLN
10 MEQ/0ML | INTRAVENOUS | Status: DC | PRN
Start: 2021-10-30 — End: 2021-10-30

## 2021-10-30 MED ORDER — NORMAL SALINE FLUSH 0.9 % IV SOLN
0.9 % | INTRAVENOUS | Status: DC | PRN
Start: 2021-10-30 — End: 2021-10-30

## 2021-10-30 MED ORDER — POLYETHYLENE GLYCOL 3350 17 G PO PACK
17 g | Freq: Every day | ORAL | Status: DC | PRN
Start: 2021-10-30 — End: 2021-10-30

## 2021-10-30 MED ORDER — PROCHLORPERAZINE EDISYLATE 10 MG/2ML IJ SOLN
10 MG/2ML | INTRAMUSCULAR | Status: AC | PRN
Start: 2021-10-30 — End: 2021-11-03
  Administered 2021-10-30 (×2): 5 mg via INTRAVENOUS

## 2021-10-30 MED ORDER — MULTIPLE VITAMINS PO TABS
Freq: Every day | ORAL | Status: DC
Start: 2021-10-30 — End: 2021-10-30

## 2021-10-30 MED ORDER — LABETALOL HCL 5 MG/ML IV SOLN
5 MG/ML | INTRAVENOUS | Status: DC | PRN
Start: 2021-10-30 — End: 2021-10-30

## 2021-10-30 MED ORDER — IOPAMIDOL 76 % IV SOLN
76 % | Freq: Once | INTRAVENOUS | Status: AC | PRN
Start: 2021-10-30 — End: 2021-10-30
  Administered 2021-10-30: 15:00:00 60 mL via INTRAVENOUS

## 2021-10-30 MED ORDER — ACETAMINOPHEN 650 MG RE SUPP
650 MG | Freq: Four times a day (QID) | RECTAL | Status: DC | PRN
Start: 2021-10-30 — End: 2021-10-30

## 2021-10-30 MED ORDER — BISACODYL 5 MG PO TBEC
5 MG | Freq: Every day | ORAL | Status: AC | PRN
Start: 2021-10-30 — End: 2021-11-03

## 2021-10-30 MED ORDER — HYDRALAZINE HCL 20 MG/ML IJ SOLN
20 MG/ML | INTRAMUSCULAR | Status: AC | PRN
Start: 2021-10-30 — End: 2021-11-01
  Administered 2021-10-30: 22:00:00 5 mg via INTRAVENOUS

## 2021-10-30 MED ORDER — NORMAL SALINE FLUSH 0.9 % IV SOLN
0.9 % | Freq: Two times a day (BID) | INTRAVENOUS | Status: DC
Start: 2021-10-30 — End: 2021-10-30

## 2021-10-30 MED ORDER — NALOXONE HCL 0.4 MG/ML IJ SOLN
0.4 MG/ML | INTRAMUSCULAR | Status: AC | PRN
Start: 2021-10-30 — End: 2021-11-03

## 2021-10-30 MED ORDER — LORAZEPAM 0.5 MG PO TABS
0.5 MG | ORAL | Status: AC | PRN
Start: 2021-10-30 — End: 2021-11-03
  Administered 2021-11-02: 06:00:00 2 mg via ORAL

## 2021-10-30 MED ORDER — ONDANSETRON HCL 4 MG/2ML IJ SOLN
4 MG/2ML | INTRAMUSCULAR | Status: AC
Start: 2021-10-30 — End: 2021-10-30
  Administered 2021-10-30: 12:00:00 4 mg via INTRAVENOUS

## 2021-10-30 MED ORDER — NORMAL SALINE FLUSH 0.9 % IV SOLN
0.9 % | Freq: Two times a day (BID) | INTRAVENOUS | Status: AC
Start: 2021-10-30 — End: 2021-11-03
  Administered 2021-10-31 – 2021-11-03 (×8): 10 mL via INTRAVENOUS

## 2021-10-30 MED ORDER — NALOXONE HCL 0.4 MG/ML IJ SOLN
0.4 MG/ML | INTRAMUSCULAR | Status: DC | PRN
Start: 2021-10-30 — End: 2021-10-30

## 2021-10-30 MED ORDER — OXYCODONE HCL 5 MG PO TABS
5 MG | Freq: Four times a day (QID) | ORAL | Status: AC | PRN
Start: 2021-10-30 — End: 2021-11-03
  Administered 2021-10-30 – 2021-11-02 (×5): 5 mg via ORAL

## 2021-10-30 MED ORDER — ALUM & MAG HYDROXIDE-SIMETH 200-200-20 MG/5ML PO SUSP
200-200-20 MG/5ML | Freq: Four times a day (QID) | ORAL | Status: DC | PRN
Start: 2021-10-30 — End: 2021-10-30

## 2021-10-30 MED ORDER — LABETALOL HCL 5 MG/ML IV SOLN
5 MG/ML | Freq: Four times a day (QID) | INTRAVENOUS | Status: DC | PRN
Start: 2021-10-30 — End: 2021-10-30

## 2021-10-30 MED ORDER — THIAMINE HCL 100 MG/ML IJ SOLN
100 MG/ML | Freq: Every day | INTRAMUSCULAR | Status: DC
Start: 2021-10-30 — End: 2021-10-30

## 2021-10-30 MED ORDER — ONDANSETRON HCL 4 MG/2ML IJ SOLN
4 MG/2ML | Freq: Four times a day (QID) | INTRAMUSCULAR | Status: DC | PRN
Start: 2021-10-30 — End: 2021-10-30

## 2021-10-30 MED ORDER — ONDANSETRON HCL 4 MG/2ML IJ SOLN
4 MG/2ML | Freq: Four times a day (QID) | INTRAMUSCULAR | Status: AC | PRN
Start: 2021-10-30 — End: 2021-11-03
  Administered 2021-10-30 – 2021-10-31 (×2): 4 mg via INTRAVENOUS

## 2021-10-30 MED ORDER — TRAMADOL HCL 50 MG PO TABS
50 MG | Freq: Four times a day (QID) | ORAL | Status: DC | PRN
Start: 2021-10-30 — End: 2021-10-30

## 2021-10-30 MED ORDER — BISACODYL 5 MG PO TBEC
5 MG | Freq: Every day | ORAL | Status: DC | PRN
Start: 2021-10-30 — End: 2021-10-30

## 2021-10-30 MED ORDER — SODIUM CHLORIDE 0.9 % IV BOLUS
0.9 % | INTRAVENOUS | Status: AC
Start: 2021-10-30 — End: 2021-10-30
  Administered 2021-10-30: 12:00:00 1000 mL via INTRAVENOUS

## 2021-10-30 MED ORDER — SODIUM CHLORIDE 0.9 % IV SOLN
0.9 % | INTRAVENOUS | Status: DC | PRN
Start: 2021-10-30 — End: 2021-10-30

## 2021-10-30 MED ORDER — THIAMINE HCL 100 MG/ML IJ SOLN
100 MG/ML | Freq: Every day | INTRAMUSCULAR | Status: AC
Start: 2021-10-30 — End: 2021-11-02
  Administered 2021-10-31 – 2021-11-01 (×2): 100 mg via INTRAVENOUS

## 2021-10-30 MED ORDER — ACETAMINOPHEN 325 MG PO TABS
325 MG | Freq: Four times a day (QID) | ORAL | Status: DC | PRN
Start: 2021-10-30 — End: 2021-10-30

## 2021-10-30 MED ORDER — ACETAMINOPHEN 325 MG PO TABS
325 | Freq: Four times a day (QID) | ORAL | Status: DC | PRN
Start: 2021-10-30 — End: 2021-11-03
  Administered 2021-10-31 – 2021-11-03 (×7): 650 mg via ORAL

## 2021-10-30 MED ORDER — MAGNESIUM SULFATE 2000 MG/50 ML IVPB PREMIX
2 GM/50ML | INTRAVENOUS | Status: DC | PRN
Start: 2021-10-30 — End: 2021-10-30

## 2021-10-30 MED ORDER — LEVETIRACETAM 500 MG/5ML IV SOLN
500 MG/5ML | Freq: Two times a day (BID) | INTRAVENOUS | Status: AC
Start: 2021-10-30 — End: 2021-11-02
  Administered 2021-10-31 – 2021-11-02 (×5): 500 mg via INTRAVENOUS

## 2021-10-30 MED ORDER — ONDANSETRON 4 MG PO TBDP
4 MG | Freq: Three times a day (TID) | ORAL | Status: DC | PRN
Start: 2021-10-30 — End: 2021-10-30

## 2021-10-30 MED ORDER — LORAZEPAM 0.5 MG PO TABS
0.5 MG | ORAL | Status: AC | PRN
Start: 2021-10-30 — End: 2021-11-03

## 2021-10-30 MED ORDER — NORMAL SALINE FLUSH 0.9 % IV SOLN
0.9 % | INTRAVENOUS | Status: AC | PRN
Start: 2021-10-30 — End: 2021-11-03

## 2021-10-30 MED ORDER — LEVETIRACETAM 500 MG/5ML IV SOLN
500 MG/5ML | Freq: Two times a day (BID) | INTRAVENOUS | Status: DC
Start: 2021-10-30 — End: 2021-10-30

## 2021-10-30 MED ORDER — LEVETIRACETAM 500 MG/5ML IV SOLN
500 MG/5ML | INTRAVENOUS | Status: AC
Start: 2021-10-30 — End: 2021-10-30
  Administered 2021-10-30: 15:00:00 500 mg via INTRAVENOUS

## 2021-10-30 MED ORDER — MULTIPLE VITAMINS PO TABS
Freq: Every day | ORAL | Status: AC
Start: 2021-10-30 — End: 2021-11-03
  Administered 2021-10-31 – 2021-11-03 (×4): 1 via ORAL

## 2021-10-30 MED ORDER — POLYETHYLENE GLYCOL 3350 17 G PO PACK
17 g | Freq: Two times a day (BID) | ORAL | Status: AC | PRN
Start: 2021-10-30 — End: 2021-11-03

## 2021-10-30 MED ORDER — OXYCODONE HCL 5 MG PO TABS
5 MG | Freq: Four times a day (QID) | ORAL | Status: DC | PRN
Start: 2021-10-30 — End: 2021-10-30

## 2021-10-30 MED FILL — ONDANSETRON HCL 4 MG/2ML IJ SOLN: 4 MG/2ML | INTRAMUSCULAR | Qty: 2

## 2021-10-30 MED FILL — PEG 3350 17 G PO PACK: 17 g | ORAL | Qty: 1

## 2021-10-30 MED FILL — BISACODYL EC 5 MG PO TBEC: 5 MG | ORAL | Qty: 1

## 2021-10-30 MED FILL — PROCHLORPERAZINE EDISYLATE 10 MG/2ML IJ SOLN: 10 MG/2ML | INTRAMUSCULAR | Qty: 2

## 2021-10-30 MED FILL — TRAMADOL HCL 50 MG PO TABS: 50 MG | ORAL | Qty: 1

## 2021-10-30 MED FILL — OXYCODONE HCL 5 MG PO TABS: 5 MG | ORAL | Qty: 1

## 2021-10-30 MED FILL — HYDRALAZINE HCL 20 MG/ML IJ SOLN: 20 MG/ML | INTRAMUSCULAR | Qty: 1

## 2021-10-30 MED FILL — STRESS FORMULA PO TABS: ORAL | Qty: 1

## 2021-10-30 MED FILL — LEVETIRACETAM 500 MG/5ML IV SOLN: 500 MG/5ML | INTRAVENOUS | Qty: 5

## 2021-10-30 MED FILL — ACETAMINOPHEN 325 MG PO TABS: 325 MG | ORAL | Qty: 2

## 2021-10-30 NOTE — Progress Notes (Addendum)
TRANSFER - IN REPORT:    Verbal report received from Byrd Hesselbach, RN on Timothy Larsen  being received from [facility] ED for routine progression of patient care      Report consisted of patient's Situation, Background, Assessment and   Recommendations(SBAR).     Information from the following report(s) Nurse Handoff Report, Intake/Output, MAR, Recent Results, Med Rec Status, and Cardiac Rhythm NSR  was reviewed with the receiving nurse.    Opportunity for questions and clarification was provided.      Assessment completed upon patient's arrival to unit and care assumed.     Dual skin assessment completed upon admission. Multiple skin abrasions noted to right knee, right lower back, right shoulder, right arm and abrasion/bruise to posterior head.

## 2021-10-30 NOTE — ED Notes (Signed)
Patient knocking on the wall yelling. Patient agitated and verbally aggressive to staff. Security called     Jenetta Downer [place], [place]  10/30/21 [phone]
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
## 2021-10-30 NOTE — ED Notes (Addendum)
Patient provided telephone for mom [phone] attempted to call patients family member no answer left a message     Calvert Cantor, RN  10/30/21 0942       Calvert Cantor, RN  10/30/21 [phone]

## 2021-10-30 NOTE — ED Notes (Signed)
Unable to d/c pt at this time. SFD is currently accessing pt chart.      Lewayne Bunting, RN  10/30/21 1344

## 2021-10-30 NOTE — ED Notes (Signed)
Family members at bedside.      Timothy Larsen, [place]  10/30/21 1304

## 2021-10-30 NOTE — Plan of Care (Signed)
Problem: Discharge Planning  Goal: Discharge to home or other facility with appropriate resources  Outcome: Progressing  Flowsheets (Taken 10/30/2021 1945)  Discharge to home or other facility with appropriate resources: Identify barriers to discharge with patient and caregiver     Problem: Pain  Goal: Verbalizes/displays adequate comfort level or baseline comfort level  Outcome: Progressing     Problem: Safety - Adult  Goal: Free from fall injury  Outcome: Progressing
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
## 2021-10-30 NOTE — ED Notes (Signed)
TRANSFER - OUT REPORT:    Verbal report given to Katie RN on Timothy Larsen  being transferred to 3111 for routine progression of patient care       Report consisted of patient's Situation, Background, Assessment and   Recommendations(SBAR).     Information from the following report(s) ED SBAR was reviewed with the receiving nurse.    Lines:   Peripheral IV 10/30/21 Distal;Right Cephalic (Active)        Opportunity for questions and clarification was provided.      Patient transported with:  Monitor and Tech       Jenetta Downer [place], [place]  10/30/21 1255

## 2021-10-30 NOTE — Progress Notes (Signed)
If we can be of any service to you during your stay please let us know      Chaplain Randy     [phone]

## 2021-10-30 NOTE — ED Provider Notes (Signed)
Emergency Department Provider Note       PCP: No primary care provider on file.   Age: 31 y.o.   Sex: male     DISPOSITION Decision To Transfer 10/30/2021 12:04:34 PM       ICD-10-CM    1. Subarachnoid hemorrhage (HCC)  I60.9       2. Acute alcoholic intoxication without complication (HCC)  R60.454       3. Intraparenchymal hemorrhage of brain [facility])  I61.9           Medical Decision Making     Complexity of Problems Addressed:  1 or more acute illnesses that pose a threat to life or bodily function.     Data Reviewed and Analyzed:  Category 1:   I independently ordered and reviewed each unique test.     The patients assessment required an independent historian: EMS.  The reason they were needed is  an altered level of consciousness.    Category 2:   I independently ordered and interpreted the ED EKG in the absence of a Cardiologist.    Rate: 55  EKG Interpretation: EKG Interpretation: sinus rhythm, no evidence of arrhythmia  ST Segments: Normal ST segments - NO STEMI  I interpreted the X-rays no pneumothorax.    Category 3: Discussion of management or test interpretation.    DDX:    Alcohol intoxication, alcohol withdrawal, drug addiction, drug abuse, polysubstance  abuse,    Electrolyte abnormality, malnutrition, alcoholic ketoacidosis,    Wernicke's encephalopathy, vitamin deficiency, stimulant-induced psychosis,     Depression      The patient was admitted and I have discussed patient management with the admitting provider.  The management of this patient was discussed with an external consultant.    Risk of Complications and/or Morbidity of Patient Management:      ED Course as of 10/30/21 1211   Sun Oct 30, 2021   0847 Ethanol Lvl: 283 [KH]   0940 Reevaluated the patient here in the emergency department.  He is slapping the wall and continues to be confused.  No evidence of head trauma but with his alcohol intoxication and confusion head CT will be obtained to rule out intracranial bleed. [KH]   1011  Intraparenchymal hemorrhage on the left temporal and frontal and scattered subarachnoid hemorrhage in the sylvian fissure [KH]   1012 Dr. Guy Sandifer with neurosurgery was consulted.  I talked him about the patient's case.  He is currently driving but as soon as he gets to the hospital we will look at the patient's films.     [KH]   [place] mother of pt was updated.    He moved to [place] 1 week ago. Drinks on the weekends. Smokes cigarettes. No known drugs.   [KH]   1201 I spoke with Dr. Sherryle Lis the neurosurgeon once again and he reviewed the patient's CTA.  He does not feel as though the patient needs to be transferred to [facility] but requested that he be admitted to the downtown ICU.  He also requested that hospitalist admit and he will consult. [UJ]   8119 Dr. Andria Frames the hospitalist was consulted and will make arrangements for patient's admission [KH]   1210 I have updated the patient's mom once again about the findings and she is about 10 minutes away from the hospital. [place]      ED Course User Index  [KH] Keene Breath, DO  History      Timothy Larsen is a 31 y.o. male who presents to the Emergency Department with chief complaint of    Chief Complaint   Patient presents with    Other      31 year old male brought to the emergency department by EMS after being found knocking on random doors.  Patient confused answering questions with nonsensical responses.  Patient states that he is from [facility] but cannot tell me where he is now.  He thinks it is 1990 when asking the year.  Patient has no reviewable medical history.         Review of Systems   Psychiatric/Behavioral:  Positive for confusion. Negative for agitation.      Physical Exam     Vitals signs and nursing note reviewed:  Vitals:    10/30/21 1045 10/30/21 1100 10/30/21 1101 10/30/21 1132   BP: 126/70 119/75  115/64   Pulse: 75 71 72 72   Resp:       Temp:       TempSrc:       SpO2: 98% 100% 99%       Physical  Exam     GENERAL:The patient has There is no height or weight on file to calculate BMI. Well-hydrated.    VITAL SIGNS: Heart rate, blood pressure, respiratory rate reviewed as recorded in  nurse's notes  EYES: Pupils reactive. Extraocular motion intact. No conjunctival redness or drainage.  EARS: No external masses or lesions.  NECK: Supple, no meningeal signs. Trachea midline. No masses or thyromegaly.  LUNGS: Breath sounds clear and equal bilaterally no accessory muscle use.  CHEST: No deformity  CARDIOVASCULAR: Regular rate and rhythm  ABDOMEN: Soft without tenderness. No palpable masses or organomegaly. No  peritoneal signs. No rigidity.  EXTREMITIES: No clubbing or cyanosis. No joint swelling. Normal muscle tone. No  restricted range of motion appreciated.  NEUROLOGIC: Sensation is grossly intact. Cranial nerve exam reveals face is  symmetrical, tongue is midline speech is clear.  SKIN: No rash or petechiae. Good skin turgor palpated.  PSYCHIATRIC: Patient mumbling responding to questions in a very soft voice but his responses do not match the questions being asked for the most part.  Patient is oriented to person and date of birth only.  Does not know the city or year or location.  Patient unable to identify any family or friends.  Not interacting with external stimuli.      Procedures     Critical Care  Performed by: Keene Breath, DO  Authorized by: Keene Breath, DO     Comments:      Critical care time: 106 minutes of critical care time was performed in the emergency department. This was separate from any other procedures listed during the patients emergency department course. The failure to initiate these interventions on an urgent basis would likely have resulted in sudden, clinically significant or life-threatening deterioration in the patients condition.      Orders Placed This Encounter   Procedures    Critical Care    XR CHEST 1 VIEW    CT HEAD WO CONTRAST    CTA HEAD NECK W CONTRAST    CBC with Auto  Differential    CMP    Salicylate    Acetaminophen Level    ETOH    Magnesium    Urine Drug Screen    TSH with Reflex    Urinalysis w rflx microscopic  T4, Free    EKG 12 Lead        Medications   0.9 % sodium chloride bolus (0 mLs IntraVENous Stopped 10/30/21 0929)   ondansetron (ZOFRAN) injection 4 mg (4 mg IntraVENous Given 10/30/21 0828)   levETIRAcetam (KEPPRA) injection 500 mg (500 mg IntraVENous Given 10/30/21 1032)   iopamidol (ISOVUE-370) 76 % injection 60 mL (60 mLs IntraVENous Given 10/30/21 1116)       New Prescriptions    No medications on file        No past medical history on file.     No past surgical history on file.     Social History     Socioeconomic History    Marital status: Single        Previous Medications    No medications on file        Results for orders placed or performed during the hospital encounter of 10/30/21   XR CHEST 1 VIEW    Narrative    EXAMINATION: XR CHEST 1 VIEW 10/30/2021 8:06 AM    ACCESSION NUMBER: [id number]    COMPARISON: None available    INDICATION: ams    TECHNIQUE: A single view of the chest was obtained.     FINDINGS:   Support Devices:   *  None    Cardiac Silhouette: Within normal limits in size.     Mediastinum: Normal mediastinal contours.     Lungs: No airspace consolidation.  No pneumothorax or sizable pleural effusion.    Upper Abdomen: Normal     Miscellaneous: No fracture or suspicious osseous lesion.        Impression    No acute cardiopulmonary abnormality.       CT HEAD WO CONTRAST    Narrative    EXAMINATION: CT HEAD WO CONTRAST 10/30/2021 9:57 AM    ACCESSION NUMBER: [id number]    COMPARISON: None available    INDICATION: etoh confusion    TECHNIQUE: Multiple-row detector helical CT examination of the head without  intravenous contrast.     Radiation dose reduction techniques were used for this study. Our CT scanners  use one or all of the following: Automated exposure control, adjustment of the  mA and/or kV according to patient size, iterative  reconstruction.    FINDINGS:  BRAIN: Several small intraparenchymal hemorrhages within the left cerebrum,  including a 15 mm hemorrhage within the left temporal lobe, and 18 mm hemorrhage  within the left frontal lobe, a 6 mm hemorrhage within the left frontal lobe.  Scattered subarachnoid hemorrhage. No mass effect or herniation. The grey-white  matter differentiation is maintained. White matter is within normal limits for  age.    VENTRICLES/EXTRA-AXIAL: No hydrocephalus or extra-axial fluid collection.    BONES: No acute fracture.    SOFT TISSUES: The paranasal sinuses and mastoid air cells are clear.         Impression    1.  Several large parenchymal hemorrhages and subarachnoid hemorrhage involving  the left frontal and temporal lobes.  2.  No midline shift.        Intraparenchymal and subarachnoid hemorrhage this finding was discussed with the  Dr. Piedad Climes via telephone at 10/30/2021 10:09 AM.    CBC with Auto Differential   Result Value Ref Range    WBC 11.8 (H) 4.3 - 11.1 K/uL    RBC 4.67 4.23 - 5.6 M/uL    Hemoglobin 14.1 13.6 - 17.2 g/dL    Hematocrit 42.7 41.1 -  50.3 %    MCV 91.4 82.0 - 102.0 FL    MCH 30.2 26.1 - 32.9 PG    MCHC 33.0 31.4 - 35.0 g/dL    RDW 13.0 11.9 - 14.6 %    Platelets 250 150 - 450 K/uL    MPV 9.6 9.4 - 12.3 FL    nRBC 0.00 0.0 - 0.2 K/uL    Differential Type AUTOMATED      Neutrophils % 82 (H) 43 - 78 %    Lymphocytes % 13 13 - 44 %    Monocytes % 4 4.0 - 12.0 %    Eosinophils % 0 (L) 0.5 - 7.8 %    Basophils % 0 0.0 - 2.0 %    Immature Granulocytes 0 0.0 - 5.0 %    Neutrophils Absolute 9.8 (H) 1.7 - 8.2 K/UL    Lymphocytes Absolute 1.5 0.5 - 4.6 K/UL    Monocytes Absolute 0.5 0.1 - 1.3 K/UL    Eosinophils Absolute 0.0 0.0 - 0.8 K/UL    Basophils Absolute 0.0 0.0 - 0.2 K/UL    Absolute Immature Granulocyte 0.1 0.0 - 0.5 K/UL   CMP   Result Value Ref Range    Sodium 143 133 - 143 mmol/L    Potassium 3.8 3.5 - 5.1 mmol/L    Chloride 107 101 - 110 mmol/L    CO2 24 21 - 32 mmol/L     Anion Gap 12 (H) 2 - 11 mmol/L    Glucose 146 (H) 65 - 100 mg/dL    BUN 13 6 - 23 MG/DL    Creatinine 1.21 0.8 - 1.5 MG/DL    Est, Glom Filt Rate >60 >60 ml/min/1.41m    Calcium 8.9 8.3 - 10.4 MG/DL    Total Bilirubin 0.3 0.2 - 1.1 MG/DL    ALT 32 12 - 65 U/L    AST 29 15 - 37 U/L    Alk Phosphatase 76 50 - 136 U/L    Total Protein 8.3 (H) 6.3 - 8.2 g/dL    Albumin 4.3 3.5 - 5.0 g/dL    Globulin 4.0 2.8 - 4.5 g/dL    Albumin/Globulin Ratio 1.1 0.4 - 1.6     Salicylate   Result Value Ref Range    Salicylate, Serum 2.4 (L) 2.8 - 20.0 MG/DL   Acetaminophen Level   Result Value Ref Range    Acetaminophen Level <10 (L) 10.0 - 30 ug/mL   ETOH   Result Value Ref Range    Ethanol Lvl 283 MG/DL   Magnesium   Result Value Ref Range    Magnesium 2.1 1.8 - 2.4 mg/dL   Urine Drug Screen   Result Value Ref Range    PCP, Urine Negative      Benzodiazepines, Urine Negative      Cocaine, Urine Negative      Amphetamine, Urine Negative      Methadone, Urine Negative      THC, TH-Cannabinol, Urine Negative      Opiates, Urine Negative      Barbiturates, Urine Negative     TSH with Reflex   Result Value Ref Range    TSH w Free Thyroid if Abnormal 0.30 (L) 0.358 - 3.740 UIU/ML   T4, Free   Result Value Ref Range    T4 Free 1.0 0.78 - 1.46 NG/DL   EKG 12 Lead   Result Value Ref Range    Ventricular Rate 55 BPM    Atrial Rate 55  BPM    P-R Interval 122 ms    QRS Duration 117 ms    Q-T Interval 431 ms    QTc Calculation (Bazett) 413 ms    P Axis 7 degrees    R Axis 95 degrees    T Axis 31 degrees    Diagnosis       Sinus rhythm  Nonspecific intraventricular conduction delay  ST elev, probable normal early repol pattern    Confirmed by [facility]  MD (UC), MATTHEW G ([id number]) on 10/30/2021 11:40:22 AM          CT HEAD WO CONTRAST   Final Result   1.  Several large parenchymal hemorrhages and subarachnoid hemorrhage involving   the left frontal and temporal lobes.   2.  No midline shift.            Intraparenchymal and subarachnoid hemorrhage  this finding was discussed with the   Dr. Piedad Climes via telephone at 10/30/2021 10:09 AM.       XR CHEST 1 VIEW   Final Result   No acute cardiopulmonary abnormality.            CTA HEAD NECK W CONTRAST    (Results Pending)        NIH Stroke Scale  Interval: Baseline  Level of Consciousness (1a): Alert  LOC Questions (1b): Answers both correctly  LOC Commands (1c): Performs both tasks correctly  Best Gaze (2): Normal  Visual (3): No visual loss  Facial Palsy (4): Normal symmetrical movement  Motor Arm, Left (5a): No drift  Motor Arm, Right (5b): No drift  Motor Leg, Left (6a): No drift  Motor Leg, Right (6b): No drift  Limb Ataxia (7): Absent  Best Language (9): No aphasia  Dysarthria (10): Normal  Extinction and Inattention (11): No abnormality            Voice dictation software was used during the making of this note.  This software is not perfect and grammatical and other typographical errors may be present.  This note has not been completely proofread for errors.       Keene Breath, DO  10/30/21 1610

## 2021-10-30 NOTE — ED Notes (Signed)
Contacted mom to come get patient. Per mom she is about 2 hrs away      [place], [place]  10/30/21 [phone]

## 2021-10-31 ENCOUNTER — Inpatient Hospital Stay: Admit: 2021-10-31

## 2021-10-31 LAB — COMPREHENSIVE METABOLIC PANEL W/ REFLEX TO MG FOR LOW K
ALT: 30 U/L (ref 12–65)
AST: 28 U/L (ref 15–37)
Albumin/Globulin Ratio: 1 (ref 0.4–1.6)
Albumin: 4 g/dL (ref 3.5–5.0)
Alk Phosphatase: 72 U/L (ref 50–136)
Anion Gap: 5 mmol/L (ref 2–11)
BUN: 10 MG/DL (ref 6–23)
CO2: 27 mmol/L (ref 21–32)
Calcium: 9.3 MG/DL (ref 8.3–10.4)
Chloride: 106 mmol/L (ref 101–110)
Creatinine: 1.1 MG/DL (ref 0.8–1.5)
Est, Glom Filt Rate: >60 mL/min/{1.73_m2} (ref >60)
Globulin: 4.2 g/dL (ref 2.8–4.5)
Glucose: 131 mg/dL — ABNORMAL HIGH (ref 65–100)
Potassium: 3.8 mmol/L (ref 3.5–5.1)
Sodium: 138 mmol/L (ref 133–143)
Total Bilirubin: 0.9 MG/DL (ref 0.2–1.1)
Total Protein: 8.2 g/dL (ref 6.3–8.2)

## 2021-10-31 LAB — CBC WITH AUTO DIFFERENTIAL
Absolute Immature Granulocyte: 0 10*3/uL (ref 0.0–0.5)
Basophils %: 0 % (ref 0.0–2.0)
Basophils Absolute: 0 10*3/uL (ref 0.0–0.2)
Eosinophils %: 0 % — ABNORMAL LOW (ref 0.5–7.8)
Eosinophils Absolute: 0 10*3/uL (ref 0.0–0.8)
Hematocrit: 40.9 % — ABNORMAL LOW (ref 41.1–50.3)
Hemoglobin: 13.6 g/dL (ref 13.6–17.2)
Immature Granulocytes: 0 % (ref 0.0–5.0)
Lymphocytes %: 8 % — ABNORMAL LOW (ref 13–44)
Lymphocytes Absolute: 0.9 10*3/uL (ref 0.5–4.6)
MCH: 30.6 PG (ref 26.1–32.9)
MCHC: 33.3 g/dL (ref 31.4–35.0)
MCV: 91.9 FL (ref 82–102)
MPV: 8.7 FL — ABNORMAL LOW (ref 9.4–12.3)
Monocytes %: 9 % (ref 4.0–12.0)
Monocytes Absolute: 1 10*3/uL (ref 0.1–1.3)
Neutrophils %: 83 % — ABNORMAL HIGH (ref 43–78)
Neutrophils Absolute: 8.9 10*3/uL — ABNORMAL HIGH (ref 1.7–8.2)
Platelets: 182 10*3/uL (ref 150–450)
RBC: 4.45 M/uL (ref 4.23–5.6)
RDW: 13.2 % (ref 11.9–14.6)
WBC: 10.7 10*3/uL (ref 4.3–11.1)
nRBC: 0 10*3/uL (ref 0.0–0.2)

## 2021-10-31 MED FILL — LEVETIRACETAM 500 MG/5ML IV SOLN: 500 MG/5ML | INTRAVENOUS | Qty: 5

## 2021-10-31 MED FILL — ONDANSETRON HCL 4 MG/2ML IJ SOLN: 4 MG/2ML | INTRAMUSCULAR | Qty: 2

## 2021-10-31 MED FILL — OXYCODONE HCL 5 MG PO TABS: 5 MG | ORAL | Qty: 1

## 2021-10-31 MED FILL — THIAMINE HCL 100 MG/ML IJ SOLN: 100 MG/ML | INTRAMUSCULAR | Qty: 2

## 2021-10-31 MED FILL — STRESS FORMULA PO TABS: ORAL | Qty: 1

## 2021-10-31 NOTE — Progress Notes (Addendum)
LTG: Patient will increase receptive/expressive language skills demonstrated by the ability to communicate basic wants/needs across environments.   STG: Patient will answer yes/no questions with 80% accuracy given minimal cueing  STG: Patient will answer questions related to daily events/biographical information/environment via yes/no with 80% accuracy given minimal cueing  STG: Patient will follow 1-step commands with moderate cueing  STG: Patient will identify item named in field of 3 with 90% accuracy given minimal cueing  STG: Patient will identify item in visual field of 2 given description with 80% accuracy given minimal cueing  STG: Patient will repeat basic phrases with 90% accuracy given minimal cueing  STG: Patient will complete sentences with 90% accuracy given minimal cueing  STG: Patient will name common objects with 80% accuracy given minimal cueing  STG: Patient will name function of object with 90% accuracy given minimal cueing  STG: Patient will name common objects with 80% accuracy given minimal cueing  STG: patient will participate in bedside swallowing evaluation   SPEECH LANGUAGE PATHOLOGY: COMMUNICATION Initial Assessment    MRN: [id number]    ADMISSION DATE: 10/30/2021  PRIMARY DIAGNOSIS: Cerebral parenchymal hemorrhage ([place])  Cerebral hemorrhage ([place]) [I61.9]    ICD-10: Treatment Diagnosis: 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 [place] or [place]; On or Off)  Model instructions/commands      Patient continues to require skilled intervention: Yes. Recommend ongoing speech therapy services during this hospitalization and next level of care        ASSESSMENT    Patient presents with fluent aphasia.  Aphasia appears most consistent with Wernicke's aphasia.  Decreased comprehension with impaired command following, inconsistent response to yes/no questions as complexity  increases, difficulty identifying objects in field >2. Impaired comprehension further impacts appropriateness of verbal responses, although responses are fluent.  For example, when asked to name common objects, patient stating "what are we doing with it?" Or "what are we looking at it for?".     He will benefit from ongoing skilled speech therapy to address functional communication as he is currently functioning below baseline.       GENERAL   Subjective: awake in bed  Comments: admitting with traumatic subarachnoid hemorrhage in the left frontal lobe.    Communication Observation: Aphasia  Respiratory Status: Room air               Prior Dysphagia History: none. tolerating regular diet. he took 1 sip of water and refused other trials for bedside swallow evaluation. will re attempt 7/18       Pain:   Patient c/o pain (head, RN notified)                                          OBJECTIVE   Patient seen for initial language evaluation  Patient participated in [place] (MAST)  Expressive Index  Naming 4/10 (with prompting, 8/10)  Automatic Speech 8/10 (perseverated on counting, "how many are we going?")  Repetition 6/10 (able to repeat at word level; breakdown at phrase level)  Writing x/10  Verbal Fluency 10/10 (e.g. "I see him on the thing, she's got a pan down there, waters coming out of the thing)  Expressive Subscale (adjusted score): 28/40    Receptive Index   Yes/No Accuracy 12/20 (patient stating "why  are we doing this?")  Objective Recognition 0/10 (no attempts to identify "OK, what?" "OK, what are we doing?",despite model)  Following Instructions 0/10 (4/10 with model)  Reading Instructions x/10 ("what am I supposed to do with it?"  Receptive Subscale (adjusted score): 12/40    Total Index   Expressive 28/50  Receptive 12/50  Total Score 40/100    Other measures:  Orientation: person, "here in hospital", city in field of 2  Identify object in field of 2: 4/4  Able to express  discomfort in head(unable to rate pain level)  "I don't want that" and handed SLP back crackers.        PLAN    Duration/Frequency: Continue to follow patient 3x/week for duration of hospitalization and/or until goals met    Speech Therapy Prognosis  Prognosis: Good    MODIFIED RANKIN SCALE (mRS) SCORE: 3  Interpretation of Tool: The Modified Rankin Scale is a scale used to quantify level of disability as it relates to a patient's functional abilities.   No Symptoms(0); No significant disability despite symptoms; able to carry out all usual duties and activities(1); Slight disability; unable to carry out all previous activities but able to look after own affairs without assistance(2); Moderate disability; requiring some help but able to walk without assistance(3); Moderately severe disability; unable to walk without assistance and unable to attend to own bodily needs without assistance(4); Severe disability; bedridden, incontinent, and requiring constant nursing care and attention(5)      Education: Patient, RN  Patient Education: aphasia, role of SLP  Patient Education Response: Needs reinforcement    PRECAUTIONS/ALLERGIES: Patient has no known allergies.     Safety Devices in place: Yes  Type of devices: Left in bed, Nurse notified, Call light within reach    Therapy Time  SLP Individual Minutes  Time In: 1240  Time Out: 1256  Minutes: [address] [place]  10/31/2021 1:01 PM

## 2021-10-31 NOTE — Consults (Addendum)
NEUROSURGERY CONSULT NOTE:     CC: traumatic subarachnoid hemorrhage    Assessment and Plan:   -no neurosurgical intervention required  -keppra 500mg  BID  -hold all antiplatelets and anticoagulants.  -Elevate head of bed to 30 degrees  -repeat head CT is stable    HPI:   y.o. male with no relevant PMH who presents  with a traumatic subarachnoid hemorrhage in the left frontal lobe.  Patient has no recollection of what occurred that brought him to the hospital but at this time is alert and oriented to self, place, and year.  According to other provider notes patient may be experiencing some intermittent confusion followed by some intermittent expressive aphasia followed by periods of clear speech and orientation.  The CT head reveals a left frontal subarachnoid hemorrhage and repeat imaging reveals that this hemorrhage is stable.  We encouraged Keppra 500 mg twice daily but there is currently no neurosurgical intervention required at this time.  Consider neurology consult for further management at this time.    No past medical history on file.  No past surgical history on file.  No Known Allergies  Social History     Socioeconomic History    Marital status: Single     Spouse name: Not on file    Number of children: Not on file    Years of education: Not on file    Highest education level: Not on file   Occupational History    Not on file   Tobacco Use    Smoking status: Unknown    Smokeless tobacco: Not on file   Vaping Use    Vaping Use: Unknown   Substance and Sexual Activity    Alcohol use: Yes    Drug use: Not on file    Sexual activity: Not on file   Other Topics Concern    Not on file   Social History Narrative    Not on file     Social Determinants of Health     Financial Resource Strain: Not on file   Food Insecurity: Not on file   Transportation Needs: Not on file   Physical Activity: Not on file   Stress: Not on file   Social Connections: Not on file   Intimate Partner Violence: Not on file    Housing Stability: Not on file         Physical Exam:   BP 129/64   Pulse 68   Temp 98.9 F (37.2 C) (Oral)   Resp 24   Ht 5\' 9"  (1.753 m)   Wt 153 lb 6.4 oz (69.6 kg)   SpO2 99%   BMI 22.65 kg/m           ROS Review of Systems    Constitutional:                    No recent weight changes, fever, fatigue, sleep difficulties, loss of appetite   ENT/Mouth:  POShearing loss, POSringing in the ears, chronic sinus problem, nose bleeds,sore throat, voice change, hoarseness, swollen glands in neck, or difficulties with chewing and swallowing.   Cardiovascular:  No chest pain/angina pectoris, palpitations, swelling of feet/ankles/hands, or calf pain while walking.     Respiratory: No chronic or frequent coughs, spitting up blood, shortness of breath, POSasthma, or wheezing.     Gastrointestinal: No a bdominal pain, heartburn, nausea, vomiting, constipation, or frequent diarrhea     Genitourinary: POS frequent urination, burning or painful urination, or  blood in urine     Musculoskeletal:   POS joint pain, stiffness/swelling, POS weakness of muscles, or POSmuscle pain/cramp     Integument:   No rash/itching     Neurological:   Headache; poor memory       Neuro Assessment:    PERLA  Alert and Oriented x4 no memory of what happened  GCS15    Speech clear  Reflexes 2+ equal and bilateral  Cranial nerves I-VII grossly intact  Sensation equal bilaterally  Pronator Drift negative    Bowel and bladder control intact  Upper extremities: shoulders 5/5 bilateral, biceps 5/5 bilateral, triceps 5/5, hand intrinsics 5/5 bilateral  Lower extremities: HF 5/5 bilateral, knees 5/5 bilateral, hamsting 5/5 bilateral, DF 5/5 bilateral, PF 5/5 bilateral    45 min    Prudence Davidson, APRN - CNP

## 2021-10-31 NOTE — Consults (Signed)
Consult    Patient: Timothy Larsen MRN: [id number]     Date of Birth: 07/17/1990  Age: 31 y.o.  Sex: male      Subjective:      Timothy Larsen is a 31 y.o. male who is being seen for intraparenchymal hemorrhage and subarachnoid hemorrhage.    The patient cannot provide history.  He was found intoxicated.  It is thought that he likely had a fall.  CT scan shows traumatic intraparenchymal hematomas as well as a traumatic subarachnoid hemorrhage.  No aneurysm seen on CTA.  The patient has both receptive and expressive aphasia and is unable to provide an accurate history.    Past medical history, surgical history, family history, social history: Could not be obtained secondary to altered mental status    Social History     Tobacco Use    Smoking status: Unknown    Smokeless tobacco: Not on file   Substance Use Topics    Alcohol use: Yes      Current Facility-Administered Medications   Medication Dose Route Frequency Provider Last Rate Last Admin    0.9 % sodium chloride infusion   IntraVENous Continuous Renee Harder, [place] 100 mL/hr at 10/31/21 1044 New Bag at 10/31/21 1044    ondansetron (ZOFRAN) injection 4 mg  4 mg IntraVENous Q6H PRN Renee Harder, PA   4 mg at 10/30/21 2008    polyethylene glycol (GLYCOLAX) packet 17 g  17 g Oral BID PRN Renee Harder, PA        naloxone [facility]) injection 0.4 mg  0.4 mg IntraVENous PRN Renee Harder, PA        levETIRAcetam (KEPPRA) injection 500 mg  500 mg IntraVENous Q12H Juel Burrow [place], [place]   500 mg at 10/31/21 0930    oxyCODONE (ROXICODONE) immediate release tablet 5 mg  5 mg Oral Q6H PRN Renee Harder, PA   5 mg at 10/31/21 0723    traMADol (ULTRAM) tablet 50 mg  50 mg Oral Q6H PRN Renee Harder, PA   50 mg at 10/30/21 1628    sodium chloride flush 0.9 % injection 5-40 mL  5-40 mL IntraVENous PRN Renee Harder, PA        sodium chloride flush 0.9 % injection 5-40 mL  5-40 mL IntraVENous 2 times per day Renee Harder, PA   10 mL at  10/31/21 0930    LORazepam (ATIVAN) tablet 1 mg  1 mg Oral Q1H PRN Renee Harder, PA        Or    LORazepam (ATIVAN) injection 1 mg  1 mg IntraVENous Q1H PRN Renee Harder, PA        Or    LORazepam (ATIVAN) tablet 2 mg  2 mg Oral Q1H PRN Renee Harder, PA        Or    LORazepam (ATIVAN) injection 2 mg  2 mg IntraVENous Q1H PRN Renee Harder, PA        Or    LORazepam (ATIVAN) tablet 3 mg  3 mg Oral Q1H PRN Renee Harder, PA        Or    LORazepam (ATIVAN) injection 3 mg  3 mg IntraVENous Q1H PRN Renee Harder, PA        Or    LORazepam (ATIVAN) tablet 4 mg  4 mg Oral Q1H PRN Renee Harder, PA        Or    LORazepam (  ATIVAN) injection 4 mg  4 mg IntraVENous Q1H PRN Renee Harder, PA        bisacodyl (DULCOLAX) EC tablet 5 mg  5 mg Oral Daily PRN Renee Harder, PA        0.9 % sodium chloride infusion   IntraVENous PRN Renee Harder, PA        multivitamin 1 tablet  1 tablet Oral Daily Kelsey Coryn [place], [place]   1 tablet at 10/31/21 0930    thiamine (B-1) injection 100 mg  100 mg IntraVENous Daily Juel Burrow [place], [place]   100 mg at 10/31/21 0930    sodium chloride flush 0.9 % injection 5-40 mL  5-40 mL IntraVENous PRN Renee Harder, PA        sodium chloride flush 0.9 % injection 5-40 mL  5-40 mL IntraVENous 2 times per day Renee Harder, PA   10 mL at 10/31/21 0930    acetaminophen (TYLENOL) tablet 650 mg  650 mg Oral Q6H PRN Renee Harder, PA   650 mg at 10/30/21 2002    Or    acetaminophen (TYLENOL) suppository 650 mg  650 mg Rectal Q6H PRN Renee Harder, PA        hydrALAZINE (APRESOLINE) injection 5 mg  5 mg IntraVENous Q4H PRN Daneil Dan, MD   5 mg at 10/30/21 1810    prochlorperazine (COMPAZINE) injection 5 mg  5 mg IntraVENous Q4H PRN Daneil Dan, MD   5 mg at 10/30/21 1940        No Known Allergies    Review of Systems: Could not obtain secondary to altered mental status.  He does report a headache  however          Objective:     Vitals:    10/31/21 0630 10/31/21 0700 10/31/21 0703 10/31/21 0733   BP: (!) 141/78 126/76 126/76 132/64   Pulse: 70 83 83 77   Resp: 14 26 26 25    Temp:  99.3 F (37.4 C) 99.3 F (37.4 C)    TempSrc:   Oral    SpO2: 99% 100% 100% 100%   Weight:       Height:            Physical Exam:   General - Well developed, well nourished, in no apparent distress. Pleasant and conversant.   HEENT - Normocephalic, atraumatic. Conjunctiva, tympanic membranes, and oropharynx are clear.   Neck - Supple without masses, no bruits   Cardiovascular - Regular rate and rhythm. Normal S1, S2 without murmurs, rubs, or gallops.  Lungs - Clear to auscultation.  Abdomen - Soft, nontender with normal bowel sounds.   Extremities - Peripheral pulses intact. No edema and no rashes.     Neurological examination -  Comprehension is poor.  He intermittently follows one-step commands.  Attention is poor.  Memory could not be tested secondary to language.  He has both receptive and expressive aphasia.  On cranial nerve examination pupils are equal, round, and reactive to light. . Visual acuity is adequate. Visual fields are full to finger confrontation. Extraocular motility is normal. Face is symmetric and sensation is intact to light touch. Hearing is intact to finger rustle bilaterally. Motor examination - There is normal muscle tone and bulk. Power is full throughout. Muscle stretch reflexes are normoactive and there are no pathological reflexes present.  He could not participate in sensory examination, cerebellar examination, gait or stance secondary to altered mental status.  CT Results (most recent): Personally Reviewed   Results for orders placed during the hospital encounter of 10/30/21    CT HEAD WO CONTRAST    Narrative  EXAMINATION: CT HEAD WITHOUT CONTRAST    DATE: 10/31/2021 3:05 AM    INDICATION: Follow up    COMPARISON: CT head 10/30/2021    TECHNIQUE: Noncontrast imaging obtained from the vertex  to the skull base.  CT  dose lowering techniques were used, to include: automated exposure control,  adjustment for patient size, and or use of iterative reconstruction.?    FINDINGS:    Redemonstration of multifocal and multi compartmental hemorrhage within the left  inferior frontal lobe and left temporal lobe consisting of hemorrhagic  contusion and subarachnoid hemorrhage. The subarachnoid hemorrhage has decreased  in conspicuity in the interim.    There is no midline shift or hydrocephalus. Basal cisterns are patent.    No new hemorrhage evident.    Globes and orbits are unremarkable. Sinuses are clear. Mastoid air cells are  clear. The skull is intact. Sinuses are clear.    Impression  No significant interval change in left frontotemporal hemorrhagic contusions.    Redemonstration of subarachnoid hemorrhage along the left frontotemporal  convexity but this has decreased in conspicuity in the interim.    No new hemorrhage. No midline shift or hydrocephalus.          Rudolpho Sevin, M.D.  10/31/2021 3:38:00 AM          Most recent CTA Personally Reviewed  Results for orders placed during the hospital encounter of 10/30/21    CTA HEAD NECK W CONTRAST    Narrative  History: Intracranial hemorrhage.    FINDINGS:    CT angiography was performed of the neck and head with contrast and  three-dimensional CT angiography reconstruction and reformat was performed.  NASCET criteria as needed. CT dose reduction was achieved through use of a  standardized protocol tailored for this examination and automatic exposure  control for dose modulation.    IV Contrast:60 cc Isovue 370.    Lung apices are clear. The aortic arch is patent. The subclavian and innominate  arteries are patent bilaterally. The vertebral arteries are patent.    The left common and internal carotid arteries are patent. The right common and  internal carotid arteries are patent.    The basilar artery is patent. The posterior cerebral arteries are patent.  The  anterior cerebral arteries are patent. The middle cerebral arteries are patent.    The dural venous sinuses are patent.    Impression  No acute cerebrovascular occlusive disease.    No intracranial aneurysms or vascular malformations are identified.    A source for intracranial hemorrhage is not identified.                  Assessment and Plan    31 year old man with traumatic intraparenchymal hematomas and a traumatic subarachnoid hemorrhage, likely secondary to falling in the setting of being intoxicated.      ICH Management   Admit to ICU with [facility] neurological checks  Maintain SBP < 140 mmHg  STAT CT head for clinical worsening  Head of the bed at 30 degrees with neck in the neutral position  Continue levetiracetam for 7 days and then stop  The patient will need speech therapy

## 2021-10-31 NOTE — Interdisciplinary (Signed)
Multi-D Rounds/Checklist (leapfrog):  Lines: can any be removed?: None       DVT Prophylaxis: Ordered  Vent: N/A  Nutrition Ordered/appropriate: Ordered  Can antibiotics or other drugs be stopped? Is Nozin performed: N/A  Inpat Anti-Infectives (From admission, onward)      None          Consults needed:  PT/OT  A: Is pain control adequate? (has PRNs? Stop drip?) Yes  B: Sedation break and SBT? N/A  C: Is sedation choice appropriate? N/A  D: Delirium/CAM-ICU? No  E: Mobility goals/appropriateness? Yes  F: Family update and plan? Mother is primary contact and is being updated daily by primary attending and nursing staff.    Terisa Starr, APRN - CNP

## 2021-10-31 NOTE — Unmapped (Signed)
[facility] Clinical Rounding Nurse Progress Report      SUBJECTIVE: Patient assessed secondary to transfer from critical care.      Vitals:    10/31/21 1930 10/31/21 1945 10/31/21 2000 10/31/21 2034   BP:   118/73 (!) 141/89   Pulse: 69 64 77 65   Resp: 18 21 16 20    Temp:    98.4 F (36.9 C)   TempSrc:    Oral   SpO2: 99% 100% 100% 100%   Weight:       Height:            DETERIORATION INDEX SCORE: 46    ASSESSMENT:  Pt is alert and oriented but d/t expressive aphasia has trouble answering questions. NIH 1-4  and he appears to be in NAD at this time. O2 sat 100%. Pt denies any pain and voices no complaints. Pt transported to 7th floor in wheelchair by RN.      PLAN:  Will follow per [facility] Clinical Rounding Program protocol.    , RN  [place]: [phone]  [place]: [phone]

## 2021-10-31 NOTE — Other (Addendum)
10/31/21 2032   NIHSS Stroke Scale   NIHSS Stroke Scale Assessed Yes   Interval Hand-off/Transfer  Baxter Hire, RN.)   Level of Consciousness (1a) 0   LOC Questions (1b) 2   LOC Commands (1c) 0   Best Gaze (2) 0   Visual (3) 0   Facial Palsy (4) 0   Motor Arm, Left (5a) 0   Motor Arm, Right (5b) 0   Motor Leg, Left (6a) 0   Motor Leg, Right (6b) 0   Limb Ataxia (7) 0   Sensory (8) 0   Best Language (9) 1   Dysarthria (10) 1   Extinction and Inattention (11) 0   Total 4       Dual with ICU Baxter Hire, RN. Change in NIH score due to subjective scoring between nurses. Nurse states no change in pt. Status. LOC questions differ due to aphasia. New NIH score agreed by both RN's.     Hospitalist Dr. Vikki Ports moody made aware.

## 2021-10-31 NOTE — Progress Notes (Signed)
This is a follow-up visit to the patient, providing a spiritual presence, emotional support and prayer. The patient appears to be resting.    Tasnia Spegal, MDIV, MRE  Chaplain

## 2021-10-31 NOTE — Progress Notes (Signed)
Hospitalist Progress Note   Admit Date:  10/30/2021  1:38 PM   Name:  Timothy Larsen   Age:  31 y.o.  Sex:  male  DOB:  05-20-1990   MRN:  [id number]   Room:  3111/01    Presenting/Chief Complaint: No chief complaint on file.     Reason(s) for Admission: Cerebral hemorrhage [facility]) [I61.9]     Hospital Course:     Copied from history and physical HPI:  Timothy Larsen is a 31 y.o. male with medical history of alcohol dependence who presented to [place] after being found by the police department roaming around knocking on doors intoxicated.  They brought him to the emergency department for further evaluation.  He initially presented poorly cooperative and confused which led to a CT of the head.  This showed a left frontal and temporal contusion with associated subarachnoid hemorrhage with minimal mass effect concerning for trauma to his head.  Neurosurgery was consulted by the emergency department who recommended admission to be ICU at [facility] downtown for neurology evaluation.     Patient was placed on Keppra.  No steroids.  Blood pressure on admission was rather stable with systolic readings in the 951O to 130 range and diastolic pressures ranging between 60 and 80.  He did present intoxicated with an ethanol level of 283, laboratory work essentially otherwise unremarkable. UDS negative. CTA of the neck has been ordered and is pending.     Patient is complaining of a headache pointing to his occipital bone where there is a palpable area of swelling.  He believes that he may have fallen but cannot provide a clear history.  He is minimally cooperative in the H&P and and physical exam.     I called his mother over the phone who states that he has no significant past medical history aside from heavy drinking.  She does not believe he has ever been through alcohol withdrawals before.  She states that in the past he had been seen by neurologist at one point and was given steroids but this was a while ago and  she does not know what this was for.     Patient will be admitted to [facility] [place], accepting MD is Dr. Corey Skains. Neurosurgery and Neurology has been made aware.     Subjective & 24hr Events:   Patient was alert to person place and month at time of my exam.  He has poor recollection of events preceding hospital admission and was intoxicated on admission.  Discussed suspected fall with head trauma and intracranial bleeding.  He appeared to understand.  Follow-up CT head unchanged.  Vital signs stable.  No significant alcohol withdrawal yet but admitted yesterday and intoxicated.     Assessment & Plan:       Cerebral parenchymal hemorrhage  - Appreciate neurosurgery and neurology consultation  - We will continue on Keppra 500 mg IV twice daily  - As needed labetalol with parameters to keep blood pressure well controlled.  At present his blood pressure is at goal of less than 140/80.  If it does trend upwards may recommend starting Cardene drip in the ICU  - Neurochecks every hour for 24 hours  - Follow-up results of CT angiogram of the head and neck  - Avoid DVT prophylaxis or other blood thinning agents  - As needed analgesia and antiemetics  7/17--- Keppra prophylaxis recommended duration per neurology note, blood pressure goals as above neurochecks as above.  Monitor for  alcohol withdrawal.     Alcohol dependence   -- CIWA   7/17--- monitor and correct any electrolyte abnormalities.     PT/OT evals and PPD needed/ordered? No  Diet: No diet orders on file  VTE prophylaxis: None  Code status: Full Code                   Non-peripheral Lines and Tubes (if present):             Hospital Problems:  Principal Problem:    Cerebral parenchymal hemorrhage ([place])  Active Problems:    Cerebral hemorrhage (HCC)    Alcohol dependence ([place])  Resolved Problems:    * No resolved hospital problems. *      Objective:   Patient Vitals for the past 24 hrs:   Temp Pulse Resp BP SpO2   10/31/21 1100 98.9 F (37.2 C) 68 24 129/64 99 %    10/31/21 1000 -- 68 30 120/62 99 %   10/31/21 0900 -- 69 -- 126/62 97 %   10/31/21 0800 -- 70 -- (!) 122/59 99 %   10/31/21 0733 -- 77 25 132/64 100 %   10/31/21 0703 99.3 F (37.4 C) 83 26 126/76 100 %   10/31/21 0700 99.3 F (37.4 C) 83 26 126/76 100 %   10/31/21 0630 -- 70 14 (!) 141/78 99 %   10/31/21 0615 -- 71 20 (!) 140/80 99 %   10/31/21 0600 -- 71 22 138/72 99 %   10/31/21 0545 -- 84 29 139/74 100 %   10/31/21 0530 -- 74 29 128/64 100 %   10/31/21 0515 -- 66 -- 125/60 100 %   10/31/21 0500 -- 70 19 134/64 100 %   10/31/21 0445 -- 86 16 128/60 99 %   10/31/21 0430 -- 73 21 138/65 99 %   10/31/21 0415 -- 73 19 (!) 143/67 100 %   10/31/21 0400 -- 76 14 137/66 100 %   10/31/21 0345 -- 75 20 127/62 100 %   10/31/21 0330 -- 80 16 (!) 122/57 100 %   10/31/21 0315 98.7 F (37.1 C) 76 18 (!) 141/73 97 %   10/31/21 0245 -- 75 16 (!) 121/58 99 %   10/31/21 0230 -- 79 20 (!) 140/62 99 %   10/31/21 0215 -- 80 22 120/60 99 %   10/31/21 0200 -- 80 20 (!) 117/59 100 %   10/31/21 0145 -- 78 12 (!) 122/55 98 %   10/31/21 0130 -- 79 16 134/64 99 %   10/31/21 0115 -- 79 16 (!) 140/67 100 %   10/31/21 0100 -- 80 17 131/60 99 %   10/31/21 0045 -- 92 18 135/65 100 %   10/31/21 0030 -- 82 16 (!) 141/67 100 %   10/31/21 0015 -- 78 17 137/63 100 %   10/31/21 0000 -- 79 18 (!) 133/57 99 %   10/30/21 2345 -- 77 16 (!) 141/65 100 %   10/30/21 2336 -- -- 16 -- --   10/30/21 2330 -- 83 19 139/62 100 %   10/30/21 2315 -- 80 17 (!) 146/63 99 %   10/30/21 2300 99.1 F (37.3 C) 80 17 (!) 147/61 99 %   10/30/21 2245 -- 74 22 (!) 148/67 99 %   10/30/21 2230 -- 81 28 139/63 99 %   10/30/21 2215 -- 76 (!) 34 (!) 143/73 100 %   10/30/21 2200 -- 77 (!) 36 (!) 144/74 100 %  10/30/21 2145 -- 80 26 (!) 141/79 100 %   10/30/21 2130 -- 83 20 130/68 100 %   10/30/21 2115 -- 81 (!) 77 135/67 100 %   10/30/21 2100 -- 77 (!) 32 (!) 150/74 100 %   10/30/21 2045 -- 76 25 (!) 145/75 100 %   10/30/21 2030 -- 79 26 (!) 141/77 100 %   10/30/21 2015 -- 74  27 138/77 (!) 78 %   10/30/21 2000 -- 91 (!) 33 (!) 160/68 100 %   10/30/21 1945 98.1 F (36.7 C) (!) 102 (!) 45 (!) 150/65 99 %   10/30/21 1930 -- 70 14 (!) 187/72 100 %   10/30/21 1915 -- 91 (!) 40 137/68 100 %   10/30/21 1900 -- 85 25 (!) 147/71 98 %   10/30/21 1845 -- 80 21 (!) 149/70 99 %   10/30/21 1535 -- 90 -- 137/72 91 %       Oxygen Therapy  SpO2: 99 %  Pulse Oximetry Type: Continuous  Pulse via Oximetry: 69 beats per minute  Pulse Oximeter Device Mode: Continuous  Pulse Oximeter Device Location: Finger  O2 Device: None (Room air)    Estimated body mass index is 22.65 kg/m as calculated from the following:    Height as of this encounter: 5' 9" (1.753 m).    Weight as of this encounter: 153 lb 6.4 oz (69.6 kg).    Intake/Output Summary (Last 24 hours) at 10/31/2021 1527  Last data filed at 10/31/2021 0500  Gross per 24 hour   Intake 1486.67 ml   Output 500 ml   Net 986.67 ml         Physical Exam:   Physical Exam:   General:                      Alert, at time of my exam.  Denied acute distress.  Eyes:                                           Conjunctivae/corneas clear. Pupils equally round and reactive to light, extraocular movements intact.   Lungs:                       Clear to auscultation bilaterally.  No wheeze.  Heart:                     Regular rate and rhythm, S1, S2 normal, no murmur, click, rub or gallop.  Abdomen:                       Soft, non-tender. Bowel sounds normal. No masses,  No organomegaly.  Extremities:                     Extremities normal, atraumatic, no cyanosis or unilateral lower extremity edema  Neurologic:                     CNII-XII intact.  No focal motor weakness.  No upper extremity pronator drift.        Lab/Data Review:  All lab results for the last 24 hours reviewed.    I have personally reviewed labs and tests:  Recent Labs:  Recent Results (from the past 48 hour(s))   CBC with Auto Differential  Collection Time: 10/30/21  7:50 AM   Result Value Ref Range     WBC 11.8 (H) 4.3 - 11.1 K/uL    RBC 4.67 4.23 - 5.6 M/uL    Hemoglobin 14.1 13.6 - 17.2 g/dL    Hematocrit 42.7 41.1 - 50.3 %    MCV 91.4 82.0 - 102.0 FL    MCH 30.2 26.1 - 32.9 PG    MCHC 33.0 31.4 - 35.0 g/dL    RDW 13.0 11.9 - 14.6 %    Platelets 250 150 - 450 K/uL    MPV 9.6 9.4 - 12.3 FL    nRBC 0.00 0.0 - 0.2 K/uL    Differential Type AUTOMATED      Neutrophils % 82 (H) 43 - 78 %    Lymphocytes % 13 13 - 44 %    Monocytes % 4 4.0 - 12.0 %    Eosinophils % 0 (L) 0.5 - 7.8 %    Basophils % 0 0.0 - 2.0 %    Immature Granulocytes 0 0.0 - 5.0 %    Neutrophils Absolute 9.8 (H) 1.7 - 8.2 K/UL    Lymphocytes Absolute 1.5 0.5 - 4.6 K/UL    Monocytes Absolute 0.5 0.1 - 1.3 K/UL    Eosinophils Absolute 0.0 0.0 - 0.8 K/UL    Basophils Absolute 0.0 0.0 - 0.2 K/UL    Absolute Immature Granulocyte 0.1 0.0 - 0.5 K/UL   CMP    Collection Time: 10/30/21  7:50 AM   Result Value Ref Range    Sodium 143 133 - 143 mmol/L    Potassium 3.8 3.5 - 5.1 mmol/L    Chloride 107 101 - 110 mmol/L    CO2 24 21 - 32 mmol/L    Anion Gap 12 (H) 2 - 11 mmol/L    Glucose 146 (H) 65 - 100 mg/dL    BUN 13 6 - 23 MG/DL    Creatinine 1.21 0.8 - 1.5 MG/DL    Est, Glom Filt Rate >60 >60 ml/min/1.7m    Calcium 8.9 8.3 - 10.4 MG/DL    Total Bilirubin 0.3 0.2 - 1.1 MG/DL    ALT 32 12 - 65 U/L    AST 29 15 - 37 U/L    Alk Phosphatase 76 50 - 136 U/L    Total Protein 8.3 (H) 6.3 - 8.2 g/dL    Albumin 4.3 3.5 - 5.0 g/dL    Globulin 4.0 2.8 - 4.5 g/dL    Albumin/Globulin Ratio 1.1 0.4 - 1.6     Salicylate    Collection Time: 10/30/21  7:50 AM   Result Value Ref Range    Salicylate, Serum 2.4 (L) 2.8 - 20.0 MG/DL   Acetaminophen Level    Collection Time: 10/30/21  7:50 AM   Result Value Ref Range    Acetaminophen Level <10 (L) 10.0 - 30 ug/mL   ETOH    Collection Time: 10/30/21  7:50 AM   Result Value Ref Range    Ethanol Lvl 283 MG/DL   Magnesium    Collection Time: 10/30/21  7:50 AM   Result Value Ref Range    Magnesium 2.1 1.8 - 2.4 mg/dL   TSH with Reflex     Collection Time: 10/30/21  7:50 AM   Result Value Ref Range    TSH w Free Thyroid if Abnormal 0.30 (L) 0.358 - 3.740 UIU/ML   T4, Free    Collection Time: 10/30/21  7:50 AM   Result Value Ref  Range    T4 Free 1.0 0.78 - 1.46 NG/DL   EKG 12 Lead    Collection Time: 10/30/21  8:26 AM   Result Value Ref Range    Ventricular Rate 55 BPM    Atrial Rate 55 BPM    P-R Interval 122 ms    QRS Duration 117 ms    Q-T Interval 431 ms    QTc Calculation (Bazett) 413 ms    P Axis 7 degrees    R Axis 95 degrees    T Axis 31 degrees    Diagnosis       Sinus rhythm  Nonspecific intraventricular conduction delay  ST elev, probable normal early repol pattern    Confirmed by [facility]  MD ([facility]), MATTHEW G ([id number]) on 10/30/2021 11:40:22 AM     Urine Drug Screen    Collection Time: 10/30/21 11:33 AM   Result Value Ref Range    PCP, Urine Negative      Benzodiazepines, Urine Negative      Cocaine, Urine Negative      Amphetamine, Urine Negative      Methadone, Urine Negative      THC, TH-Cannabinol, Urine Negative      Opiates, Urine Negative      Barbiturates, Urine Negative     Urinalysis w rflx microscopic    Collection Time: 10/30/21 11:33 AM   Result Value Ref Range    Color, UA YELLOW/STRAW      Appearance CLEAR      Specific Gravity, UA 1.020 1.001 - 1.023      pH, Urine 6.0 5.0 - 9.0      Protein, UA Negative NEG mg/dL    Glucose, UA Negative mg/dL    Ketones, Urine 15 (A) NEG mg/dL    Bilirubin Urine Negative NEG      Blood, Urine Negative NEG      Urobilinogen, Urine 0.2 0.2 - 1.0 EU/dL    Nitrite, Urine Negative NEG      Leukocyte Esterase, Urine Negative NEG     Comprehensive Metabolic Panel w/ Reflex to MG    Collection Time: 10/31/21  3:52 AM   Result Value Ref Range    Sodium 138 133 - 143 mmol/L    Potassium 3.8 3.5 - 5.1 mmol/L    Chloride 106 101 - 110 mmol/L    CO2 27 21 - 32 mmol/L    Anion Gap 5 2 - 11 mmol/L    Glucose 131 (H) 65 - 100 mg/dL    BUN 10 6 - 23 MG/DL    Creatinine 1.10 0.8 - 1.5 MG/DL    Est, Glom Filt  Rate >60 >60 ml/min/1.72m    Calcium 9.3 8.3 - 10.4 MG/DL    Total Bilirubin 0.9 0.2 - 1.1 MG/DL    ALT 30 12 - 65 U/L    AST 28 15 - 37 U/L    Alk Phosphatase 72 50 - 136 U/L    Total Protein 8.2 6.3 - 8.2 g/dL    Albumin 4.0 3.5 - 5.0 g/dL    Globulin 4.2 2.8 - 4.5 g/dL    Albumin/Globulin Ratio 1.0 0.4 - 1.6     CBC with Auto Differential    Collection Time: 10/31/21  3:52 AM   Result Value Ref Range    WBC 10.7 4.3 - 11.1 K/uL    RBC 4.45 4.23 - 5.6 M/uL    Hemoglobin 13.6 13.6 - 17.2 g/dL    Hematocrit 40.9 (L)  41.1 - 50.3 %    MCV 91.9 82 - 102 FL    MCH 30.6 26.1 - 32.9 PG    MCHC 33.3 31.4 - 35.0 g/dL    RDW 13.2 11.9 - 14.6 %    Platelets 182 150 - 450 K/uL    MPV 8.7 (L) 9.4 - 12.3 FL    nRBC 0.00 0.0 - 0.2 K/uL    Differential Type AUTOMATED      Neutrophils % 83 (H) 43 - 78 %    Lymphocytes % 8 (L) 13 - 44 %    Monocytes % 9 4.0 - 12.0 %    Eosinophils % 0 (L) 0.5 - 7.8 %    Basophils % 0 0.0 - 2.0 %    Immature Granulocytes 0 0.0 - 5.0 %    Neutrophils Absolute 8.9 (H) 1.7 - 8.2 K/UL    Lymphocytes Absolute 0.9 0.5 - 4.6 K/UL    Monocytes Absolute 1.0 0.1 - 1.3 K/UL    Eosinophils Absolute 0.0 0.0 - 0.8 K/UL    Basophils Absolute 0.0 0.0 - 0.2 K/UL    Absolute Immature Granulocyte 0.0 0.0 - 0.5 K/UL       Current Meds:  Current Facility-Administered Medications   Medication Dose Route Frequency    ondansetron (ZOFRAN) injection 4 mg  4 mg IntraVENous Q6H PRN    polyethylene glycol (GLYCOLAX) packet 17 g  17 g Oral BID PRN    naloxone (NARCAN) injection 0.4 mg  0.4 mg IntraVENous PRN    levETIRAcetam (KEPPRA) injection 500 mg  500 mg IntraVENous Q12H    oxyCODONE (ROXICODONE) immediate release tablet 5 mg  5 mg Oral Q6H PRN    traMADol (ULTRAM) tablet 50 mg  50 mg Oral Q6H PRN    sodium chloride flush 0.9 % injection 5-40 mL  5-40 mL IntraVENous PRN    sodium chloride flush 0.9 % injection 5-40 mL  5-40 mL IntraVENous 2 times per day    LORazepam (ATIVAN) tablet 1 mg  1 mg Oral Q1H PRN    Or     LORazepam (ATIVAN) injection 1 mg  1 mg IntraVENous Q1H PRN    Or    LORazepam (ATIVAN) tablet 2 mg  2 mg Oral Q1H PRN    Or    LORazepam (ATIVAN) injection 2 mg  2 mg IntraVENous Q1H PRN    Or    LORazepam (ATIVAN) tablet 3 mg  3 mg Oral Q1H PRN    Or    LORazepam (ATIVAN) injection 3 mg  3 mg IntraVENous Q1H PRN    Or    LORazepam (ATIVAN) tablet 4 mg  4 mg Oral Q1H PRN    Or    LORazepam (ATIVAN) injection 4 mg  4 mg IntraVENous Q1H PRN    bisacodyl (DULCOLAX) EC tablet 5 mg  5 mg Oral Daily PRN    0.9 % sodium chloride infusion   IntraVENous PRN    multivitamin 1 tablet  1 tablet Oral Daily    thiamine (B-1) injection 100 mg  100 mg IntraVENous Daily    sodium chloride flush 0.9 % injection 5-40 mL  5-40 mL IntraVENous PRN    sodium chloride flush 0.9 % injection 5-40 mL  5-40 mL IntraVENous 2 times per day    acetaminophen (TYLENOL) tablet 650 mg  650 mg Oral Q6H PRN    Or    acetaminophen (TYLENOL) suppository 650 mg  650 mg Rectal Q6H PRN  hydrALAZINE (APRESOLINE) injection 5 mg  5 mg IntraVENous Q4H PRN    prochlorperazine (COMPAZINE) injection 5 mg  5 mg IntraVENous Q4H PRN       Signed:  Randolm Idol, DO    Part of this note may have been written by using a voice dictation software.  The note has been proof read but may still contain some grammatical/other typographical errors.

## 2021-11-01 LAB — CBC WITH AUTO DIFFERENTIAL
Absolute Immature Granulocyte: 0 10*3/uL (ref 0.0–0.5)
Basophils %: 0 % (ref 0.0–2.0)
Basophils Absolute: 0 10*3/uL (ref 0.0–0.2)
Eosinophils %: 0 % — ABNORMAL LOW (ref 0.5–7.8)
Eosinophils Absolute: 0 10*3/uL (ref 0.0–0.8)
Hematocrit: 40.3 % — ABNORMAL LOW (ref 41.1–50.3)
Hemoglobin: 13.6 g/dL (ref 13.6–17.2)
Immature Granulocytes: 0 % (ref 0.0–5.0)
Lymphocytes %: 8 % — ABNORMAL LOW (ref 13–44)
Lymphocytes Absolute: 0.7 10*3/uL (ref 0.5–4.6)
MCH: 30.8 PG (ref 26.1–32.9)
MCHC: 33.7 g/dL (ref 31.4–35.0)
MCV: 91.4 FL (ref 82–102)
MPV: 10.6 FL (ref 9.4–12.3)
Monocytes %: 7 % (ref 4.0–12.0)
Monocytes Absolute: 0.7 10*3/uL (ref 0.1–1.3)
Neutrophils %: 85 % — ABNORMAL HIGH (ref 43–78)
Neutrophils Absolute: 7.6 10*3/uL (ref 1.7–8.2)
Platelets: 196 10*3/uL (ref 150–450)
RBC: 4.41 M/uL (ref 4.23–5.6)
RDW: 12.7 % (ref 11.9–14.6)
WBC: 9.1 10*3/uL (ref 4.3–11.1)
nRBC: 0 10*3/uL (ref 0.0–0.2)

## 2021-11-01 LAB — BASIC METABOLIC PANEL W/ REFLEX TO MG FOR LOW K
Anion Gap: 4 mmol/L (ref 2–11)
BUN: 9 MG/DL (ref 6–23)
CO2: 27 mmol/L (ref 21–32)
Calcium: 9.4 MG/DL (ref 8.3–10.4)
Chloride: 104 mmol/L (ref 101–110)
Creatinine: 0.8 MG/DL (ref 0.8–1.5)
Est, Glom Filt Rate: >60 mL/min/{1.73_m2} (ref >60)
Glucose: 121 mg/dL — ABNORMAL HIGH (ref 65–100)
Potassium: 3.6 mmol/L (ref 3.5–5.1)
Sodium: 135 mmol/L (ref 133–143)

## 2021-11-01 MED ORDER — HYDRALAZINE HCL 20 MG/ML IJ SOLN
20 MG/ML | INTRAMUSCULAR | Status: DC | PRN
Start: 2021-11-01 — End: 2021-11-01
  Administered 2021-11-01: 05:00:00 5 mg via INTRAVENOUS

## 2021-11-01 MED ORDER — LABETALOL HCL 5 MG/ML IV SOLN
5 MG/ML | INTRAVENOUS | Status: DC | PRN
Start: 2021-11-01 — End: 2021-11-03
  Administered 2021-11-01 – 2021-11-02 (×2): 10 mg via INTRAVENOUS

## 2021-11-01 MED FILL — HYDRALAZINE HCL 20 MG/ML IJ SOLN: 20 MG/ML | INTRAMUSCULAR | Qty: 1

## 2021-11-01 MED FILL — ACETAMINOPHEN 325 MG PO TABS: 325 MG | ORAL | Qty: 2

## 2021-11-01 MED FILL — STRESS FORMULA PO TABS: ORAL | Qty: 1

## 2021-11-01 MED FILL — LEVETIRACETAM 500 MG/5ML IV SOLN: 500 MG/5ML | INTRAVENOUS | Qty: 5

## 2021-11-01 MED FILL — LABETALOL HCL 5 MG/ML IV SOLN: 5 MG/ML | INTRAVENOUS | Qty: 20

## 2021-11-01 MED FILL — THIAMINE HCL 100 MG/ML IJ SOLN: 100 MG/ML | INTRAMUSCULAR | Qty: 2

## 2021-11-01 NOTE — Progress Notes (Addendum)
Hospitalist Progress Note   Admit Date:  10/30/2021  1:38 PM   Name:  Timothy Larsen   Age:  31 y.o.  Sex:  male  DOB:  December 30, 1990   MRN:  [id number]   Room:  701/01    Presenting/Chief Complaint: No chief complaint on file.     Reason(s) for Admission: Cerebral hemorrhage [facility]) [I61.9]     Hospital Course:   Haadi Santellan is a 31 y.o. male with medical history of alcohol dependence who presented to [facility] after being found by the police department roaming around knocking on doors intoxicated.  They brought him to the emergency department for further evaluation.  He initially presented poorly cooperative and confused which led to a CT of the head.  This showed a left frontal and temporal contusion with associated subarachnoid hemorrhage with minimal mass effect concerning for trauma to his head.  Neurosurgery was consulted by the emergency department who recommended admission to be ICU at [facility] downtown for neurology evaluation.     Patient was placed on Keppra.  No steroids.  Blood pressure on admission was rather stable with systolic readings in the 563O to 130 range and diastolic pressures ranging between 60 and 80.  He did present intoxicated with an ethanol level of 283, laboratory work essentially otherwise unremarkable. UDS negative. CTA of the neck has been ordered and is pending.     Patient is complaining of a headache pointing to his occipital bone where there is a palpable area of swelling.  He believes that he may have fallen but cannot provide a clear history.  He is minimally cooperative in the H&P and and physical exam.     I called his mother over the phone who states that he has no significant past medical history aside from heavy drinking.  She does not believe he has ever been through alcohol withdrawals before.  She states that in the past he had been seen by neurologist at one point and was given steroids but this was a while ago and she does not know what this was for.      Patient will be admitted to [facility] downtown, accepting MD is Dr. Corey Skains. Neurosurgery and Neurology has been made aware.    Repeat CT head unremarkable. Neurosurgery & neurology have weighed in.    Transferred from ICU. Neuro status remains unchanged to improving.    Subjective & 24hr Events:   Patient was assessed at bedside. No acute events overnight. He states "i'm good" but is not able to provide any further detail which is reported by nursing staff from ICU sign out to be his baseline. At bedside he has no complaints.       Assessment & Plan:     Traumatic Cerebral parenchymal hemorrhage  Neurologic status remains unchanged from presentation, repeat CT head without interval change  CTA head & neck without source for intracranial hemorrhage & no intracranial aneurysms or vascular malformations idenitifed  Appreciate neurosurgery and neurology consultation  Continue on Keppra 500 mg IV twice daily x 7 days total  Continue PRN labetalol, maintain SBP <140   Neurochecks every hour for 24 hours, then NIH every 4 hrs  Repeat CT head STAT for any neurologic worsening  Avoid DVT prophylaxis or other blood thinning agents  As needed analgesia and antiemetics    HTN  BP parameters as above  Continue to monitor      Excessive alcohol use   Continue to monitor for  Ssx of W/D; CIWA   monitor and correct any electrolyte abnormalities.    PT/OT evals and PPD needed/ordered?  Yes  Diet:  ADULT DIET; Regular  VTE prophylaxis:  holding, contraindicated  Code status: Full Code        Non-peripheral Lines and Tubes (if present):           Hospital Problems:  Principal Problem:    Cerebral parenchymal hemorrhage ([place])  Active Problems:    Cerebral hemorrhage ([place])    Alcohol dependence ([place])  Resolved Problems:    * No resolved hospital problems. *      Objective:   Patient Vitals for the past 24 hrs:   Temp Pulse Resp BP SpO2   11/01/21 0704 98.6 F (37 C) 65 16 (!) 140/83 100 %   11/01/21 0540 -- 65 -- 127/79 --   11/01/21  0530 -- 63 -- (!) 149/96 100 %   11/01/21 0400 98.8 F (37.1 C) 74 18 126/71 100 %   11/01/21 0200 -- 71 -- 138/88 --   11/01/21 0047 -- 69 -- (!) 148/92 --   11/01/21 0001 -- -- -- -- 100 %   11/01/21 0000 98.2 F (36.8 C) 71 16 (!) 141/91 --   10/31/21 2034 98.4 F (36.9 C) 65 20 (!) 141/89 100 %   10/31/21 2000 98.2 F (36.8 C) 77 16 118/73 100 %   10/31/21 1945 -- 64 21 -- 100 %   10/31/21 1930 -- 69 18 -- 99 %   10/31/21 1915 -- 80 16 -- (!) 89 %   10/31/21 1900 -- 66 20 126/74 99 %   10/31/21 1600 -- 64 23 121/62 100 %   10/31/21 1500 98.7 F (37.1 C) 82 -- -- 100 %   10/31/21 1400 -- 89 -- 130/83 --   10/31/21 1300 -- 68 -- 127/73 100 %   10/31/21 1200 -- 66 -- 124/72 99 %   10/31/21 1100 98.9 F (37.2 C) 68 24 129/64 99 %   10/31/21 1000 -- 68 30 120/62 99 %   10/31/21 0900 -- 69 -- 126/62 97 %   10/31/21 0800 -- 70 -- (!) 122/59 99 %   10/31/21 0733 -- 77 25 132/64 100 %       Oxygen Therapy  SpO2: 100 %  Pulse Oximetry Type: Continuous  Pulse via Oximetry: 73 beats per minute  Pulse Oximeter Device Mode: Continuous  Pulse Oximeter Device Location: Finger  O2 Device: None (Room air)    Estimated body mass index is 22.65 kg/m as calculated from the following:    Height as of this encounter: 5' 9"  (1.753 m).    Weight as of this encounter: 153 lb 6.4 oz (69.6 kg).    Intake/Output Summary (Last 24 hours) at 11/01/2021 0709  Last data filed at 10/31/2021 1748  Gross per 24 hour   Intake 900 ml   Output --   Net 900 ml         Physical Exam:     General:    Well nourished.    Head:  Normocephalic, atraumatic  Eyes:  Sclerae appear normal.  Pupils equally round.  ENT:  Nares appear normal.  Moist oral mucosa  Neck:  No restricted ROM.  Trachea midline   CV:   RRR.  No m/r/g.  No jugular venous distension.  Lungs:   CTAB.  No wheezing, rhonchi, or rales.  Symmetric expansion.  Abdomen:   Soft, nontender, nondistended.  Extremities: No cyanosis or clubbing.  No edema  Skin:     No rashes and normal  coloration.   Warm and dry.    Psych:  Normal mood and affect.    Neuro:  CN II-XII grossly intact.  Strength 5/5 bilateral UE and LE.  Sensation intact bilateral UE and LE. No drift.    I have personally reviewed labs and tests:  Recent Labs:  Recent Results (from the past 48 hour(s))   CBC with Auto Differential    Collection Time: 10/30/21  7:50 AM   Result Value Ref Range    WBC 11.8 (H) 4.3 - 11.1 K/uL    RBC 4.67 4.23 - 5.6 M/uL    Hemoglobin 14.1 13.6 - 17.2 g/dL    Hematocrit 42.7 41.1 - 50.3 %    MCV 91.4 82.0 - 102.0 FL    MCH 30.2 26.1 - 32.9 PG    MCHC 33.0 31.4 - 35.0 g/dL    RDW 13.0 11.9 - 14.6 %    Platelets 250 150 - 450 K/uL    MPV 9.6 9.4 - 12.3 FL    nRBC 0.00 0.0 - 0.2 K/uL    Differential Type AUTOMATED      Neutrophils % 82 (H) 43 - 78 %    Lymphocytes % 13 13 - 44 %    Monocytes % 4 4.0 - 12.0 %    Eosinophils % 0 (L) 0.5 - 7.8 %    Basophils % 0 0.0 - 2.0 %    Immature Granulocytes 0 0.0 - 5.0 %    Neutrophils Absolute 9.8 (H) 1.7 - 8.2 K/UL    Lymphocytes Absolute 1.5 0.5 - 4.6 K/UL    Monocytes Absolute 0.5 0.1 - 1.3 K/UL    Eosinophils Absolute 0.0 0.0 - 0.8 K/UL    Basophils Absolute 0.0 0.0 - 0.2 K/UL    Absolute Immature Granulocyte 0.1 0.0 - 0.5 K/UL   CMP    Collection Time: 10/30/21  7:50 AM   Result Value Ref Range    Sodium 143 133 - 143 mmol/L    Potassium 3.8 3.5 - 5.1 mmol/L    Chloride 107 101 - 110 mmol/L    CO2 24 21 - 32 mmol/L    Anion Gap 12 (H) 2 - 11 mmol/L    Glucose 146 (H) 65 - 100 mg/dL    BUN 13 6 - 23 MG/DL    Creatinine 1.21 0.8 - 1.5 MG/DL    Est, Glom Filt Rate >60 >60 ml/min/1.63m    Calcium 8.9 8.3 - 10.4 MG/DL    Total Bilirubin 0.3 0.2 - 1.1 MG/DL    ALT 32 12 - 65 U/L    AST 29 15 - 37 U/L    Alk Phosphatase 76 50 - 136 U/L    Total Protein 8.3 (H) 6.3 - 8.2 g/dL    Albumin 4.3 3.5 - 5.0 g/dL    Globulin 4.0 2.8 - 4.5 g/dL    Albumin/Globulin Ratio 1.1 0.4 - 1.6     Salicylate    Collection Time: 10/30/21  7:50 AM   Result Value Ref Range    Salicylate,  Serum 2.4 (L) 2.8 - 20.0 MG/DL   Acetaminophen Level    Collection Time: 10/30/21  7:50 AM   Result Value Ref Range    Acetaminophen Level <10 (L) 10.0 - 30 ug/mL   ETOH    Collection Time: 10/30/21  7:50 AM   Result Value Ref  Range    Ethanol Lvl 283 MG/DL   Magnesium    Collection Time: 10/30/21  7:50 AM   Result Value Ref Range    Magnesium 2.1 1.8 - 2.4 mg/dL   TSH with Reflex    Collection Time: 10/30/21  7:50 AM   Result Value Ref Range    TSH w Free Thyroid if Abnormal 0.30 (L) 0.358 - 3.740 UIU/ML   T4, Free    Collection Time: 10/30/21  7:50 AM   Result Value Ref Range    T4 Free 1.0 0.78 - 1.46 NG/DL   EKG 12 Lead    Collection Time: 10/30/21  8:26 AM   Result Value Ref Range    Ventricular Rate 55 BPM    Atrial Rate 55 BPM    P-R Interval 122 ms    QRS Duration 117 ms    Q-T Interval 431 ms    QTc Calculation (Bazett) 413 ms    P Axis 7 degrees    R Axis 95 degrees    T Axis 31 degrees    Diagnosis       Sinus rhythm  Nonspecific intraventricular conduction delay  ST elev, probable normal early repol pattern    Confirmed by [facility]  MD (UC), MATTHEW G ([id number]) on 10/30/2021 11:40:22 AM     Urine Drug Screen    Collection Time: 10/30/21 11:33 AM   Result Value Ref Range    PCP, Urine Negative      Benzodiazepines, Urine Negative      Cocaine, Urine Negative      Amphetamine, Urine Negative      Methadone, Urine Negative      THC, TH-Cannabinol, Urine Negative      Opiates, Urine Negative      Barbiturates, Urine Negative     Urinalysis w rflx microscopic    Collection Time: 10/30/21 11:33 AM   Result Value Ref Range    Color, UA YELLOW/STRAW      Appearance CLEAR      Specific Gravity, UA 1.020 1.001 - 1.023      pH, Urine 6.0 5.0 - 9.0      Protein, UA Negative NEG mg/dL    Glucose, UA Negative mg/dL    Ketones, Urine 15 (A) NEG mg/dL    Bilirubin Urine Negative NEG      Blood, Urine Negative NEG      Urobilinogen, Urine 0.2 0.2 - 1.0 EU/dL    Nitrite, Urine Negative NEG      Leukocyte Esterase, Urine  Negative NEG     Comprehensive Metabolic Panel w/ Reflex to MG    Collection Time: 10/31/21  3:52 AM   Result Value Ref Range    Sodium 138 133 - 143 mmol/L    Potassium 3.8 3.5 - 5.1 mmol/L    Chloride 106 101 - 110 mmol/L    CO2 27 21 - 32 mmol/L    Anion Gap 5 2 - 11 mmol/L    Glucose 131 (H) 65 - 100 mg/dL    BUN 10 6 - 23 MG/DL    Creatinine 1.10 0.8 - 1.5 MG/DL    Est, Glom Filt Rate >60 >60 ml/min/1.82m    Calcium 9.3 8.3 - 10.4 MG/DL    Total Bilirubin 0.9 0.2 - 1.1 MG/DL    ALT 30 12 - 65 U/L    AST 28 15 - 37 U/L    Alk Phosphatase 72 50 - 136 U/L    Total Protein  8.2 6.3 - 8.2 g/dL    Albumin 4.0 3.5 - 5.0 g/dL    Globulin 4.2 2.8 - 4.5 g/dL    Albumin/Globulin Ratio 1.0 0.4 - 1.6     CBC with Auto Differential    Collection Time: 10/31/21  3:52 AM   Result Value Ref Range    WBC 10.7 4.3 - 11.1 K/uL    RBC 4.45 4.23 - 5.6 M/uL    Hemoglobin 13.6 13.6 - 17.2 g/dL    Hematocrit 40.9 (L) 41.1 - 50.3 %    MCV 91.9 82 - 102 FL    MCH 30.6 26.1 - 32.9 PG    MCHC 33.3 31.4 - 35.0 g/dL    RDW 13.2 11.9 - 14.6 %    Platelets 182 150 - 450 K/uL    MPV 8.7 (L) 9.4 - 12.3 FL    nRBC 0.00 0.0 - 0.2 K/uL    Differential Type AUTOMATED      Neutrophils % 83 (H) 43 - 78 %    Lymphocytes % 8 (L) 13 - 44 %    Monocytes % 9 4.0 - 12.0 %    Eosinophils % 0 (L) 0.5 - 7.8 %    Basophils % 0 0.0 - 2.0 %    Immature Granulocytes 0 0.0 - 5.0 %    Neutrophils Absolute 8.9 (H) 1.7 - 8.2 K/UL    Lymphocytes Absolute 0.9 0.5 - 4.6 K/UL    Monocytes Absolute 1.0 0.1 - 1.3 K/UL    Eosinophils Absolute 0.0 0.0 - 0.8 K/UL    Basophils Absolute 0.0 0.0 - 0.2 K/UL    Absolute Immature Granulocyte 0.0 0.0 - 0.5 K/UL   CBC with Auto Differential    Collection Time: 11/01/21  4:33 AM   Result Value Ref Range    WBC 9.1 4.3 - 11.1 K/uL    RBC 4.41 4.23 - 5.6 M/uL    Hemoglobin 13.6 13.6 - 17.2 g/dL    Hematocrit 40.3 (L) 41.1 - 50.3 %    MCV 91.4 82 - 102 FL    MCH 30.8 26.1 - 32.9 PG    MCHC 33.7 31.4 - 35.0 g/dL    RDW 12.7 11.9 - 14.6 %     Platelets 196 150 - 450 K/uL    MPV 10.6 9.4 - 12.3 FL    nRBC 0.00 0.0 - 0.2 K/uL    Differential Type AUTOMATED      Neutrophils % 85 (H) 43 - 78 %    Lymphocytes % 8 (L) 13 - 44 %    Monocytes % 7 4.0 - 12.0 %    Eosinophils % 0 (L) 0.5 - 7.8 %    Basophils % 0 0.0 - 2.0 %    Immature Granulocytes 0 0.0 - 5.0 %    Neutrophils Absolute 7.6 1.7 - 8.2 K/UL    Lymphocytes Absolute 0.7 0.5 - 4.6 K/UL    Monocytes Absolute 0.7 0.1 - 1.3 K/UL    Eosinophils Absolute 0.0 0.0 - 0.8 K/UL    Basophils Absolute 0.0 0.0 - 0.2 K/UL    Absolute Immature Granulocyte 0.0 0.0 - 0.5 K/UL   Basic Metabolic Panel w/ Reflex to MG    Collection Time: 11/01/21  4:33 AM   Result Value Ref Range    Sodium 135 133 - 143 mmol/L    Potassium 3.6 3.5 - 5.1 mmol/L    Chloride 104 101 - 110 mmol/L    CO2 27 21 -  32 mmol/L    Anion Gap 4 2 - 11 mmol/L    Glucose 121 (H) 65 - 100 mg/dL    BUN 9 6 - 23 MG/DL    Creatinine 0.80 0.8 - 1.5 MG/DL    Est, Glom Filt Rate >60 >60 ml/min/1.3m    Calcium 9.4 8.3 - 10.4 MG/DL       Current Meds:  Current Facility-Administered Medications   Medication Dose Route Frequency    hydrALAZINE (APRESOLINE) injection 5 mg  5 mg IntraVENous Q4H PRN    ondansetron (ZOFRAN) injection 4 mg  4 mg IntraVENous Q6H PRN    polyethylene glycol (GLYCOLAX) packet 17 g  17 g Oral BID PRN    naloxone (NARCAN) injection 0.4 mg  0.4 mg IntraVENous PRN    levETIRAcetam (KEPPRA) injection 500 mg  500 mg IntraVENous Q12H    oxyCODONE (ROXICODONE) immediate release tablet 5 mg  5 mg Oral Q6H PRN    traMADol (ULTRAM) tablet 50 mg  50 mg Oral Q6H PRN    sodium chloride flush 0.9 % injection 5-40 mL  5-40 mL IntraVENous PRN    sodium chloride flush 0.9 % injection 5-40 mL  5-40 mL IntraVENous 2 times per day    LORazepam (ATIVAN) tablet 1 mg  1 mg Oral Q1H PRN    Or    LORazepam (ATIVAN) injection 1 mg  1 mg IntraVENous Q1H PRN    Or    LORazepam (ATIVAN) tablet 2 mg  2 mg Oral Q1H PRN    Or    LORazepam (ATIVAN) injection 2 mg  2 mg  IntraVENous Q1H PRN    Or    LORazepam (ATIVAN) tablet 3 mg  3 mg Oral Q1H PRN    Or    LORazepam (ATIVAN) injection 3 mg  3 mg IntraVENous Q1H PRN    Or    LORazepam (ATIVAN) tablet 4 mg  4 mg Oral Q1H PRN    Or    LORazepam (ATIVAN) injection 4 mg  4 mg IntraVENous Q1H PRN    bisacodyl (DULCOLAX) EC tablet 5 mg  5 mg Oral Daily PRN    0.9 % sodium chloride infusion   IntraVENous PRN    multivitamin 1 tablet  1 tablet Oral Daily    thiamine (B-1) injection 100 mg  100 mg IntraVENous Daily    sodium chloride flush 0.9 % injection 5-40 mL  5-40 mL IntraVENous PRN    sodium chloride flush 0.9 % injection 5-40 mL  5-40 mL IntraVENous 2 times per day    acetaminophen (TYLENOL) tablet 650 mg  650 mg Oral Q6H PRN    Or    acetaminophen (TYLENOL) suppository 650 mg  650 mg Rectal Q6H PRN    prochlorperazine (COMPAZINE) injection 5 mg  5 mg IntraVENous Q4H PRN       Signed:  AAnders Grant DO    Part of this note may have been written by using a voice dictation software.  The note has been proof read but may still contain some grammatical/other typographical errors.

## 2021-11-01 NOTE — Other (Signed)
Per T[facility] Pt in and out of sinus rhythm and accelerated junctional. Hospitalist made aware.       11/01/21 0000   Vital Signs   BP (!) 141/91     Hospitalist made aware of BP. PRN hydralazine modified from >145 to >140.     Pt. In bed resting, call light within reach.

## 2021-11-01 NOTE — Unmapped (Signed)
RRT Clinical Rounding Nurse Update    Vitals:    11/01/21 0530 11/01/21 0540 11/01/21 0800 11/01/21 1200   BP: (!) 149/96 127/79 (!) 140/83 135/77   Pulse: 63 65 65 63   Resp:   16 16   Temp:   98.6 F (37 C) 98.4 F (36.9 C)   TempSrc:   Oral Oral   SpO2: 100%  100% 99%   Weight:       Height:            DETERIORATION INDEX SCORE: 28    ASSESSMENT:  Previous outreach assessment was reviewed. There have been no significant changes since previous assessment. BP stable with PRNs. NIH 4, GCS 14.    PLAN:  Will discharge from RRT Clinical Rounding Program per protocol. Please call if needed.    Audie Pinto, RN  [place]: [phone]

## 2021-11-01 NOTE — Unmapped (Signed)
RRT Clinical Rounding Nurse Update    Vitals:    11/01/21 0000 11/01/21 0001 11/01/21 0047 11/01/21 0200   BP: (!) 141/91  (!) 148/92 138/88   Pulse: 71  69 71   Resp: 16      Temp: 98.2 F (36.8 C)      TempSrc: Oral      SpO2:  100%     Weight:       Height:            DETERIORATION INDEX SCORE: 28    ASSESSMENT:  Previous outreach assessment was reviewed. There have been no significant changes since previous assessment.    PLAN:  Will follow per RRT Clinical Rounding Program protocol.    Lattie Corns, RN  [place]: [phone]  [place]: [phone]

## 2021-11-01 NOTE — Progress Notes (Signed)
ACUTE PHYSICAL THERAPY GOALS:   (Developed with and agreed upon by patient and/or caregiver.)  LTG:  (1.)Mr. Doetsch will move from supine to sit and sit to supine , scoot up and down, and roll side to side in bed with INDEPENDENCE within 7 treatment day(s).    (2.)Mr. Geary will transfer from bed to chair and chair to bed with INDEPENDENCE using the least restrictive device within 7 treatment day(s).    (3.)Mr. Quiocho will ambulate with INDEPENDENCE for 500 feet with the least restrictive device within 7 treatment day(s).  (4.)Mr. Woodburn will perform standing static and dynamic balance activities x 15 minutes with INDEPENDENCE to improve safety within 7 day(s).  (5.)Mr. Salazar will ascend and descend 10 stairs using R hand rail(s) with SUPERVISION to improve functional mobility and safety within 7 day(s).      ________________________________________________________________________________________________        PHYSICAL THERAPY Initial Assessment and AM  (Link to Caseload Tracking: PT Visit Days : 1  Acknowledge Orders  Time In/Out  PT Charge Capture  Rehab Caseload Tracker    Itamar Mcgowan is a 31 y.o. male   PRIMARY DIAGNOSIS: Cerebral parenchymal hemorrhage ([place])  Cerebral hemorrhage ([place]) [I61.9]       Reason for Referral: Difficulty in walking, Not elsewhere classified (R26.2)  Inpatient: Payor: /     ASSESSMENT:     REHAB RECOMMENDATIONS:   Recommendation to date pending progress:  Setting:  No further skilled physical therapy after discharge from hospital    Equipment:    None     ASSESSMENT:  Mr. Thon presents to PT with [facility] AROM and WFL strength in B LEs.  Pt admitted with L frontal/temporal bleed and found by police knocking on doors.  He appears to have expressive aphasia today so difficult to assess prior level of function.  Pt was able to follow commands today but demonstrated some impulsive tendencies.  His SBP parameter is less than 140 today.  Pt was able to come to sitting on EOB with supervision and  good sitting balance.  He also was able to stand and ambulate in hall today with supervision and good-fair+ standing balance.  Pt c/o head/neck pain and transferred to recliner at end of session.  Mr. Dieujuste could benefit from skilled PT as he is currently functioning below his baseline.       [facility]	[place] AM-PACT "6 Clicks" Basic Mobility Inpatient Short Form  AM-PAC Basic Mobility - Inpatient   How much help is needed turning from your back to your side while in a flat bed without using bedrails?: None  How much help is needed moving from lying on your back to sitting on the side of a flat bed without using bedrails?: None  How much help is needed moving to and from a bed to a chair?: None  How much help is needed standing up from a chair using your arms?: None  How much help is needed walking in hospital room?: None  How much help is needed climbing 3-5 steps with a railing?: None  AM-PAC Inpatient Mobility Raw Score : 24  AM-PAC Inpatient T-Scale Score : 61.14  Mobility Inpatient CMS 0-100% Score: 0  Mobility Inpatient CMS G-Code Modifier : CH    SUBJECTIVE:   Mr. Leverich states, "Just chillin'"     Social/Functional    PLOF unknown given expressive aphasia  OBJECTIVE:     PAIN: VITALS / O2: PRECAUTION / LINES / DRAINS:   Pre Treatment:  Pain Assessment: None - Denies Pain      Post Treatment: head/neck pain but unable to give number Vitals        Oxygen      None    RESTRICTIONS/PRECAUTIONS:  Restrictions/Precautions: Fall Risk                 GROSS EVALUATION: Intact Impaired (Comments):   AROM [x]     PROM []    Strength [x]     Balance []  Good-fair+ standing   Posture [] N/A   Sensation []     Coordination [x]      Tone []     Edema []    Activity Tolerance []  Increased head/neck pain with     []      COGNITION/  PERCEPTION: Intact Impaired (Comments):   Orientation []  NA   Vision []     Hearing [x]     Cognition  []  Follows commands but impulsive     MOBILITY: I Mod I S SBA CGA Min Mod Max Total  NT  x2 Comments:   Bed Mobility    Rolling [x] [] [] [] [] [] [] [] [] [] []    Supine to Sit [x] [] [] [] [] [] [] [] [] [] []    Scooting [x] [] [] [] [] [] [] [] [] [] []    Sit to Supine [] [] [] [] [] [] [] [] [] [] []    Transfers    Sit to Stand [] [] [x] [] [] [] [] [] [] [] []    Bed to Chair [] [] [x] [] [] [] [] [] [] [] []    Stand to Sit [] [] [x] [] [] [] [] [] [] [] []     [] [] [] [] [] [] [] [] [] [] []    I=Independent, Mod I=Modified Independent, S=Supervision, SBA=Standby Assistance, CGA=Contact Guard Assistance,   Min=Minimal Assistance, Mod=Moderate Assistance, Max=Maximal Assistance, Total=Total Assistance, NT=Not Tested    GAIT: I Mod I S SBA CGA Min Mod Max Total  NT x2 Comments:   Level of Assistance [] [] [x] [] [] [] [] [] [] [] []    Distance   200 feet    DME None    Gait Quality Trunk sway increased    Weightbearing Status      Stairs      I=Independent, Mod I=Modified Independent, S=Supervision, SBA=Standby Assistance, CGA=Contact Guard Assistance,   Min=Minimal Assistance, Mod=Moderate Assistance, Max=Maximal Assistance, Total=Total Assistance, NT=Not Tested    PLAN:   FREQUENCY AND DURATION: 3 times/week for duration of hospital stay or until stated goals are met, whichever comes first.    THERAPY PROGNOSIS: Good    PROBLEM LIST:   (Skilled intervention is medically necessary to address:)  Decreased Activity Tolerance  Decreased Balance  Decreased Cognition  Decreased Safety Awareness INTERVENTIONS PLANNED:   (Benefits and precautions of physical therapy have been discussed with the patient.)  Therapeutic Activity  Neuromuscular Re-education  Gait Training  Education       TREATMENT:   EVALUATION: LOW COMPLEXITY: (Untimed Charge)    TREATMENT:   Co-Treatment PT/OT necessary due to patient's decreased overall endurance/tolerance levels, as well as need for high level skilled assistance to complete functional transfers/mobility and functional tasks  Therapeutic Activity (23 Minutes):  Therapeutic activity included Supine to Sit, Transfer Training, Ambulation on level ground, and Standing balance  to improve functional Activity tolerance, Balance, Mobility, and Strength.    TREATMENT GRID:  N/A    AFTER TREATMENT PRECAUTIONS: Alarm Activated, Bed/Chair Locked, Call light within reach, Chair, and Needs within reach    INTERDISCIPLINARY COLLABORATION:  RN/ PCT, PT/ PTA, and OT/ COTA    EDUCATION: Education Given To: Patient  Education Provided: Role of Therapy    TIME IN/OUT:  Time In: 1032  Time Out: [place]  Minutes: 26    Earle Burson A. Rosana Hoes, PT

## 2021-11-01 NOTE — Care Coordination-Inpatient (Signed)
Chart reviewed by case manager for potential discharge needs or concerns.  PT/OT evals complete with no further therapy needed.  SLP services needed at dc.  Pt is uninsured so may need medication assistance at dc.  Pt also needs referral for PCP.  CM will follow up with pt/mother tomorrow to these issues and other potential needs. Please notify/consult case manager if other discharge needs arise.       11/01/21 1724   Service Assessment   Patient Orientation Alert and Oriented;Self;Person   Cognition Alert   History Provided By Medical Record   Primary Caregiver Self   Support Systems Parent;Family Members   Patient's Healthcare Decision Maker is: Legal Next of Kin   PCP Verified by CM No   Prior Functional Level Independent in ADLs/IADLs   Current Functional Level Independent in ADLs/IADLs   Can patient return to prior living arrangement Unknown at present   Ability to make needs known: Fair   Family able to assist with home care needs: Yes   Would you like for me to discuss the discharge plan with any other family members/significant others, and if so, who? Yes  (mother)   Psychologist, occupational None   Social/Functional History   ADL Assistance Independent   Geophysicist/field seismologist Independent   Occupation Unemployed   Discharge Planning   Type of Residence House   Living Arrangements Alone   Current Services Prior To Admission None   Potential Assistance Needed N/A   DME Ordered? No   Potential Assistance Purchasing Medications Yes   Type of Home Care Services None   Patient expects to be discharged to: [facility] At/After Discharge   Transition of Care Consult (CM Consult) N/A   Services At/After Discharge None   [employer] Information Provided? No   Mode of Transport at Discharge Other (see comment)  (family)   Confirm Follow Up Transport Family   Condition of Participation: Discharge Planning   The Plan for Transition of Care is related to the  following treatment goals: to be determined based on clinical course

## 2021-11-01 NOTE — Progress Notes (Signed)
ACUTE OCCUPATIONAL THERAPY GOALS:   (Developed with and agreed upon by patient and/or caregiver.)  1. Patient will complete lower body bathing and dressing with INDEPENDENCE  2. Patient will complete toileting with INDEPENDENCE.   3. Patient will tolerate 30 minutes of OT treatment with 0 rest breaks to increase activity tolerance for ADLs.   4. Patient will complete functional transfers with INDEPENDENCE.  5. Patient will complete functional mobility for household distances with INDEPENDENCE  6. Patient will complete self-grooming while standing edge of sink with INDEPENDENCE.  7. Patient will complete functional tasks with no cues for facilitation from therapist 100% of therapy session.    Timeframe: 7 visits       OCCUPATIONAL THERAPY Initial Assessment, Daily Note, and AM       OT Visit Days: 1   Acknowledge Orders  Time  OT Charge Capture  Rehab Caseload Tracker      Timothy Larsen is a 31 y.o. 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 Tangen presents with deficits in overall strength, activity tolerance, activities of daily living performance, and functional mobility. Presents for traumatic SAH (w/ minimal mass effect). Neurosurgery deemed non-surgical; SBP goal < 140.  Ongoing confusion ( global aphasia per chart). Unable to say where he was or why he is in the hospital. Alert and cooperative with therapy today. Increased verbal cues required for task facilitation today however was able to follow commands well. Expressive aphasia notable however no noted receptive aphasia during session.       Today, BUE are generally decreased but WFL.; sensation, coordination, and  vision are intact. Supervision for functional bed mobility/supine <> sit transfer to edge of bed; intact unsupported edge of bed sitting balance. Performed sit <> stand and mobility to bedside sink with CGA. CGA to perform self-grooming tasks, while standing EOS, however verbal cues required for task facilitation. Proceeded to complete functional mobility for household distances with CGA and fair + dynamic standing balance. Left sitting upright in chair with needs met.  At this time, Timothy Larsen is functioning below baseline for activities of daily living and functional mobility. Patient would benefit from skilled OT services to address OT goals and plan of care        [facility] AM-PACT "6 Clicks" Daily Activity Inpatient Short Form:    AM-PAC Daily Activity - Inpatient   How much help is needed for putting on and taking off regular lower body clothing?: A Little  How much help is needed for bathing (which includes washing, rinsing, drying)?: A Little  How much help is needed for toileting (which includes using toilet, bedpan, or urinal)?: A Little  How much help is needed for putting on and taking off regular upper body clothing?: A Little  How much help is needed for taking care of personal grooming?: A Little  How much help for eating meals?: A Little  AM-PAC Inpatient Daily Activity Raw Score: 18  AM-PAC Inpatient ADL T-Scale Score : 38.66  ADL Inpatient CMS 0-100% Score: 46.65  ADL Inpatient CMS G-Code Modifier : CK  SUBJECTIVE:     Mr. Mccants states, "I'm just chillin."     Social/Functional    Difficult to assess d/t expressive aphasia however pt reporting he was independent and working prior to hospitalization  OBJECTIVE:     LINES / DRAINS / AIRWAY: None    RESTRICTIONS/PRECAUTIONS:  Restrictions/Precautions: Fall Risk    PAIN: VITALS / O2:   Pre Treatment:      0/10    Post Treatment: 0 /10       Vitals          Oxygen     RA       GROSS EVALUATION: INTACT IMPAIRED   (See Comments)   UE  AROM [x]  []    UE PROM [x]  []    Strength []   Generally decreased but WFL     Posture / Balance []     intact unsupported edge of bed sitting balance  intact static standing balance  Fair + dynamic standing balance   Sensation [x]      Coordination [x]        Tone [x]        Edema [x]     Activity Tolerance [x]        Hand Dominance R []  L []       COGNITION/  PERCEPTION: INTACT IMPAIRED   (See Comments)   Orientation []   Expressive aphasia limiting evaluation   Vision [x]      Hearing [x]      Cognition  []   AMS/confusion   Perception []   Decreased insight into deficits     MOBILITY: I Mod I S SBA CGA Min Mod Max Total  NT x2 Comments:   Bed Mobility    Rolling []  []  [x]  []  []  []  []  []  []  []  []     Supine to Sit []  []  [x]  []  []  []  []  []  []  []  []     Scooting []  []  [x]  []  []  []  []  []  []  []  []     Sit to Supine []  []  []  []  []  []  []  []  []  []  []     Transfers    Sit to Stand []  []  []  []  [x]  []  []  []  []  []  []     Bed to Chair []  []  []  []  [x]  []  []  []  []  []  []     Stand to Sit []  []  []  []  [x]  []  []  []  []  []  []     Tub/Shower []  []  []  []  []  []  []  []  []  []  []      Toilet []  []  []  []  []  []  []  []  []  []  []       []  []  []  []  []  []  []  []  []  []  []     I=Independent, Mod I=Modified Independent, S=Supervision/Setup, SBA=Standby Assistance, CGA=Contact Guard Assistance, Min=Minimal Assistance, Mod=Moderate Assistance, Max=Maximal Assistance, Total=Total Assistance, NT=Not Tested    ACTIVITIES OF DAILY LIVING: I Mod I S SBA CGA Min Mod Max Total NT Comments   BASIC ADLs:              Upper Body Bathing  []  []  []  []  []  []  []  []  []  []     Lower Body Bathing []  []  []  []  []  []  []  []  []  []     Toileting []  []  []  []  []  []  []  []  []  []     Upper Body Dressing []  []  [x]  []  []  []  []  []  []  []  gown   Lower Body Dressing []  []  [x]  []  []  []  []  []  []  []  Donning socks   Feeding []  []  []  []  []  []  []  []  []  []   Grooming []  []  []  []  []  []  []  []  []  []     Personal Device Care []  []  []  []  []  []  []  []  []  []     Functional Mobility []  []  []  []  [x]  []  []  []  []  []  X 2     I=Independent, Mod I=Modified Independent, S=Supervision/Setup, SBA=Standby Assistance, CGA=Contact Guard Assistance, Min=Minimal Assistance, Mod=Moderate Assistance, Max=Maximal Assistance, Total=Total Assistance, NT=Not Tested    PLAN:   FREQUENCY/DURATION   OT Plan of Care: 3 times/week for duration of hospital stay or until stated goals are met, whichever comes first.    PROBLEM LIST:   (Skilled intervention is medically necessary to address:)  Decreased ADL/Functional Activities  Decreased Activity Tolerance  Decreased Balance  Decreased Cognition  Decreased Safety Awareness  Decreased Strength   INTERVENTIONS PLANNED:  (Benefits and precautions of occupational therapy have been discussed with the patient.)  Self Care Training  Therapeutic Activity  Therapeutic Exercise/HEP  Neuromuscular Re-education  Manual Therapy  Education         TREATMENT:     EVALUATION: LOW COMPLEXITY: (Untimed Charge)    TREATMENT:   Co-Treatment PT/OT necessary due to patient's decreased overall endurance/tolerance levels, as well as need for high level skilled assistance to complete functional transfers/mobility and functional tasks  Self Care (23 minutes): Patient participated in upper body dressing, lower body dressing, and grooming ADLs in unsupported sitting and standing with minimal visual and verbal cueing to increase independence, decrease assistance required, increase activity tolerance, and increase safety awareness. Patient also participated in functional mobility, functional transfer, and energy conservation training to increase independence, decrease assistance required, increase activity tolerance, and increase safety awareness.     TREATMENT GRID:  N/A    AFTER TREATMENT PRECAUTIONS: Alarm Activated, Call light within reach, Chair, Needs within reach, and RN notified    INTERDISCIPLINARY COLLABORATION:  RN/ PCT, PT/ PTA, and OT/ COTA    EDUCATION:  Education Given To: Patient  Education Provided: Role of Therapy;Plan  of Care  Barriers to Learning: Cognition (aphasia)  Education Outcome: Continued education needed     TOTAL TREATMENT DURATION AND TIME:  Time In: 1031  Time Out: [place]  Minutes: [place], OT

## 2021-11-01 NOTE — Progress Notes (Addendum)
LTG: Patient will increase receptive/expressive language skills demonstrated by the ability to communicate basic wants/needs across environments.   STG: Patient will answer yes/no questions with 80% accuracy given minimal cueing  STG: Patient will answer questions related to daily events/biographical information/environment via yes/no with 80% accuracy given minimal cueing  STG: Patient will follow 1-step commands with moderate cueing  STG: Patient will identify item named in field of 3 with 90% accuracy given minimal cueing  STG: Patient will identify item in visual field of 2 given description with 80% accuracy given minimal cueing  STG: Patient will repeat basic phrases with 90% accuracy given minimal cueing  STG: Patient will complete sentences with 90% accuracy given minimal cueing  STG: Patient will name common objects with 80% accuracy given minimal cueing  STG: Patient will name function of object with 90% accuracy given minimal cueing  STG: patient will participate in bedside swallowing evaluation. 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      ICD-10: Treatment Diagnosis: R13.11 Dysphagia, Oral Phase  F80.2 Mixed Receptive-Expressive Language Disorder    RECOMMENDATIONS   Diet Solids Recommendation: Easy to Chew  Liquid Consistency Recommendation:  Thin  Recommended Form of Meds: PO       Compensatory Swallowing Strategies : Remain upright for 30-45 minutes after meals;Eat/Feed slowly;Upright as possible for all oral intake;Small bites/sips;Assist feed  Therapeutic Interventions: Patient/Family education;Diet tolerance monitoring    Other recommendations:   Aphasia treatment  Low stimulation environment  Assist with meals  Model directions  Give patient 2 choices         Patient continues to require skilled intervention: Yes. Recommend ongoing speech therapy services during this hospitalization and next level of care      ASSESSMENT   Communication: Aphasia appears most consistent with Wernicke's aphasia.    Significant receptive aphasia with impaired understanding of basic yes/no questions, requires model to follow 1-step commands unless more automatic (e.g. handing patient a cup and saying take a sip versus open your mouth or point to your nose), and inability to consistently identify object in field of 2. Patient appears to understand at very basic level in highly contextual environment.   Fluent verbal expression, but responses are typically not appropriate lacking key details with neologisms due to patient not understanding the question.     Recommend ongoing speech therapy to address functional communication as patient is currently functioning significantly below baseline and will need total assistance with ADLS (meds, cooking, finances, etc).  He has decreased awareness of significant communication breakdown.        Dysphagia Diagnosis: Mild oral stage dysphagia, but limited assessment of solids as patient only accepted 2 bites. Recommend easy to chew diet and thin liquids. Recommend supervision/assist with meals due to cognition. Discussed findings/recommendations with nursing.       GENERAL   Subjective: awake in bed  Behavior/Cognition: Alert;Requires cueing;Doesn't follow directions  Communication Observation:  Aphasia  Follows Directions:  None  Patient Positioning: Partially reclined  Respiratory Status: Room air            Prior Dysphagia History: none at baseline     Current Diet : Regular  Current Liquid Diet : Thin      Pain:   Patient does not appear in pain                                            OBJECTIVE   Dysphagia: patient seen for bedside swallow evaluation.   He required encouragement to participate.   Oral Motor   Labial: Decreased seal  Oral Hygiene: Clean  Lingual: No impairment    He consumed sips of thin liquid by straw and 2 bites of cracker without overt s/sx aspiration. Mildly increased oral phase that appeared related to cognition and decreased understanding of assessment. He rolled over and pulled sheets over his head.       Communication: patient seen for aphasia treatment.   Oriented to name.   When asked where are you? Patient responded "just chilling" "here at the hotel" given field of 2, patient oriented to hospital  Unable to state reason, even field of 2 "just trying to get it right"    Yes/no questions(highly contextual/environment): 7/8  Yes/no questions(description/function): 3/8    ID object in field of 3: 1/5  ID object in field of 2: 4/10     Confrontational naming: 0/3 ("can we stop doing this forreal?")   neologism during confrontational naming task: empiyo for target word pen      PLAN    Duration/Frequency: Continue to follow patient 3x/week for duration of hospitalization and/or until goals met    Speech Therapy Prognosis  Prognosis: Good    Dysphagia Outcome and Severity Scale (DOSS)  Dysphagia Outcome Severity Scale: Level 5: Mild dysphagia- Distant supervision. May need one diet consistency restricted  Interpretation of Tool: The Dysphagia Outcome and Severity Scale (DOSS) is a simple, easy-to-use, 7-point scale developed to systematically rate the functional severity of dysphagia based on objective assessment and make recommendations for diet level, independence level, and type of nutrition.   Normal(7),  Functional(6), Mild(5), Mild-Moderate(4), Moderate(3), Moderate-Severe(2), Severe(1)    MODIFIED RANKIN SCALE (mRS) SCORE: 3  Interpretation of Tool: The Modified Rankin Scale is a scale used to quantify level of disability as it relates to a patient's functional abilities.   No Symptoms(0); No significant disability despite symptoms; able to carry out all usual duties and activities(1); Slight disability; unable to carry out all previous activities but able to look after own affairs without assistance(2); Moderate disability; requiring some help but able to walk without assistance(3); Moderately severe disability; unable to walk without assistance and unable to attend to own bodily needs without assistance(4); Severe disability; bedridden, incontinent, and requiring constant nursing care and attention(5)    Education: Patient, RN  Patient Education: aphasia, role of SLP, diet recommendation  Patient Education Response: Needs reinforcement, No evidence of learning    PRECAUTIONS/ALLERGIES: Patient has no known allergies.      Safety Devices in place: Yes  Type of devices: Left in bed, Nurse notified, Call light within reach    Therapy Time  SLP Individual Minutes  Time In: 0958  Time Out: [place]  Minutes: [place], [facility], [place]  Rutha Bouchard, SLP  11/01/2021 10:38 AM

## 2021-11-01 NOTE — Unmapped (Signed)
RRT Clinical Rounding Nurse Progress Report      SUBJECTIVE: Patient assessed secondary to transfer from critical care.      Vitals:    11/01/21 0400 11/01/21 0530 11/01/21 0540 11/01/21 0800   BP: 126/71 (!) 149/96 127/79 (!) 140/83   Pulse: 74 63 65 65   Resp: 18   16   Temp: 98.8 F (37.1 C)   98.6 F (37 C)   TempSrc: Oral   Oral   SpO2: 100% 100%  100%   Weight:       Height:            DETERIORATION INDEX SCORE: 29    ASSESSMENT:  Patient is alert and oriented x3. Aphasia complicating orientation assessment. PT unable to state recent events or reason for admission. GCS 14, NIH 3. VS, labs, and progress notes reviewed. No needs or concerns expressed at this time.     No concerns per primary RN.     11/01/21 0819   NIHSS Stroke Scale   Interval Other (Comment)  (RRT assessment)   Level of Consciousness (1a) 0   LOC Questions (1b) 1   LOC Commands (1c) 0   Best Gaze (2) 0   Visual (3) 0   Facial Palsy (4) 0   Motor Arm, Left (5a) 0   Motor Arm, Right (5b) 0   Motor Leg, Left (6a) 0   Motor Leg, Right (6b) 0   Limb Ataxia (7) 0   Sensory (8) 0   Best Language (9) 1   Dysarthria (10) 1   Extinction and Inattention (11) 0   Total 3       PLAN:  Will follow per RRT Clinical Rounding Program protocol.    Audie Pinto, RN  [place]: [phone]

## 2021-11-02 ENCOUNTER — Inpatient Hospital Stay: Admit: 2021-11-02

## 2021-11-02 LAB — CBC WITH AUTO DIFFERENTIAL
Absolute Immature Granulocyte: 0 10*3/uL (ref 0.0–0.5)
Basophils %: 0 % (ref 0.0–2.0)
Basophils Absolute: 0 10*3/uL (ref 0.0–0.2)
Eosinophils %: 0 % — ABNORMAL LOW (ref 0.5–7.8)
Eosinophils Absolute: 0 10*3/uL (ref 0.0–0.8)
Hematocrit: 42.5 % (ref 41.1–50.3)
Hemoglobin: 14.1 g/dL (ref 13.6–17.2)
Immature Granulocytes: 0 % (ref 0.0–5.0)
Lymphocytes %: 11 % — ABNORMAL LOW (ref 13–44)
Lymphocytes Absolute: 0.9 10*3/uL (ref 0.5–4.6)
MCH: 30.4 PG (ref 26.1–32.9)
MCHC: 33.2 g/dL (ref 31.4–35.0)
MCV: 91.6 FL (ref 82–102)
MPV: 10.3 FL (ref 9.4–12.3)
Monocytes %: 9 % (ref 4.0–12.0)
Monocytes Absolute: 0.7 10*3/uL (ref 0.1–1.3)
Neutrophils %: 80 % — ABNORMAL HIGH (ref 43–78)
Neutrophils Absolute: 6.5 10*3/uL (ref 1.7–8.2)
Platelets: 204 10*3/uL (ref 150–450)
RBC: 4.64 M/uL (ref 4.23–5.6)
RDW: 12.3 % (ref 11.9–14.6)
WBC: 8.2 10*3/uL (ref 4.3–11.1)
nRBC: 0 10*3/uL (ref 0.0–0.2)

## 2021-11-02 LAB — BASIC METABOLIC PANEL W/ REFLEX TO MG FOR LOW K
Anion Gap: 7 mmol/L (ref 2–11)
BUN: 10 MG/DL (ref 6–23)
CO2: 29 mmol/L (ref 21–32)
Calcium: 9.1 MG/DL (ref 8.3–10.4)
Chloride: 102 mmol/L (ref 101–110)
Creatinine: 1 MG/DL (ref 0.8–1.5)
Est, Glom Filt Rate: >60 mL/min/{1.73_m2} (ref >60)
Glucose: 141 mg/dL — ABNORMAL HIGH (ref 65–100)
Potassium: 3.3 mmol/L — ABNORMAL LOW (ref 3.5–5.1)
Sodium: 138 mmol/L (ref 133–143)

## 2021-11-02 LAB — MAGNESIUM: Magnesium: 2.3 mg/dL (ref 1.8–2.4)

## 2021-11-02 MED ORDER — GADOTERIDOL 279.3 MG/ML IV SOLN
279.3 MG/ML | Freq: Once | INTRAVENOUS | Status: AC | PRN
Start: 2021-11-02 — End: 2021-11-02
  Administered 2021-11-02: 15:00:00 15 mL via INTRAVENOUS

## 2021-11-02 MED ORDER — POTASSIUM CHLORIDE CRYS ER 20 MEQ PO TBCR
20 MEQ | Freq: Once | ORAL | Status: AC
Start: 2021-11-02 — End: 2021-11-02
  Administered 2021-11-02: 13:00:00 40 meq via ORAL

## 2021-11-02 MED ORDER — LEVETIRACETAM 500 MG PO TABS
500 MG | Freq: Two times a day (BID) | ORAL | Status: AC
Start: 2021-11-02 — End: 2021-11-03
  Administered 2021-11-02 – 2021-11-03 (×3): 500 mg via ORAL

## 2021-11-02 MED ORDER — THIAMINE HCL 100 MG PO TABS
100 MG | Freq: Every day | ORAL | Status: AC
Start: 2021-11-02 — End: 2021-11-03
  Administered 2021-11-02 – 2021-11-03 (×2): 100 mg via ORAL

## 2021-11-02 MED ORDER — LABETALOL HCL 200 MG PO TABS
200 MG | Freq: Two times a day (BID) | ORAL | Status: AC
Start: 2021-11-02 — End: 2021-11-03
  Administered 2021-11-02 – 2021-11-03 (×3): 50 mg via ORAL

## 2021-11-02 MED FILL — LEVETIRACETAM 500 MG/5ML IV SOLN: 500 MG/5ML | INTRAVENOUS | Qty: 5

## 2021-11-02 MED FILL — LABETALOL HCL 200 MG PO TABS: 200 MG | ORAL | Qty: 1

## 2021-11-02 MED FILL — OXYCODONE HCL 5 MG PO TABS: 5 MG | ORAL | Qty: 1

## 2021-11-02 MED FILL — THIAMINE HCL 100 MG PO TABS: 100 MG | ORAL | Qty: 1

## 2021-11-02 MED FILL — STRESS FORMULA PO TABS: ORAL | Qty: 1

## 2021-11-02 MED FILL — LEVETIRACETAM 500 MG PO TABS: 500 MG | ORAL | Qty: 1

## 2021-11-02 MED FILL — POTASSIUM CHLORIDE CRYS ER 20 MEQ PO TBCR: 20 MEQ | ORAL | Qty: 2

## 2021-11-02 MED FILL — ACETAMINOPHEN 325 MG PO TABS: 325 MG | ORAL | Qty: 2

## 2021-11-02 MED FILL — LORAZEPAM 0.5 MG PO TABS: 0.5 MG | ORAL | Qty: 4

## 2021-11-02 NOTE — Progress Notes (Signed)
LTG: Patient will increase receptive/expressive language skills demonstrated by the ability to communicate basic wants/needs across environments.   STG: Patient will answer yes/no questions with 80% accuracy given minimal cueing  STG: Patient will answer questions related to daily events/biographical information/environment via yes/no with 80% accuracy given minimal cueing  STG: Patient will follow 1-step commands with moderate cueing  STG: Patient will identify item named in field of 3 with 90% accuracy given minimal cueing  STG: Patient will identify item in visual field of 2 given description with 80% accuracy given minimal cueing  STG: Patient will repeat basic phrases with 90% accuracy given minimal cueing  STG: Patient will complete sentences with 90% accuracy given minimal cueing  STG: Patient will name common objects with 80% accuracy given minimal cueing  STG: Patient will name function of object with 90% accuracy given minimal cueing  STG: patient will participate in bedside swallowing evaluation. MET 7/18  LTG: patient will tolerate safest, least restrictive oral diet without s/sx airway compromise  STG: Patient will tolerate  soft/bite sized diet and thin liquids without overt s/sx aspiration. 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      ICD-10: Treatment Diagnosis: R13.11 Dysphagia, Oral Phase  F80.2 Mixed Receptive-Expressive Language Disorder    RECOMMENDATIONS   Diet Solids Recommendation: Soft & Bite Sized  Liquid Consistency  Recommendation: Thin  Recommended Form of Meds: PO       Compensatory Swallowing Strategies : Remain upright for 30-45 minutes after meals;Eat/Feed slowly;Upright as possible for all oral intake;Small bites/sips;Assist feed  Therapeutic Interventions: Patient/Family education;Diet tolerance monitoring    Other recommendations:   Aphasia treatment  Low stimulation environment  Assist with meals   Model directions  Please note patient is unable to consistently answer yes/no questions.      Patient continues to require skilled intervention: Yes. Recommend ongoing speech therapy services during this hospitalization and next level of care      ASSESSMENT   Communication: Aphasia appears most consistent with Wernicke's aphasia.   Comprehension is significantly impaired impacting ability to answer questions reliably, follow commands, and identify objects in visual field of 2. His speech is fluent; however, due to comprehension deficits and lack of self monitoring, he has difficulty answering questions appropriately.   He lacks insight into significant functional communication deficits.   Discussed with patient's mother aphasia and need for assist, particularly at discharge given safety concerns.       Dysphagia Diagnosis: Mild oral stage dysphagia complicated by cognition with slowed oral prep and intermittent holding. He requires cues to initiate feeding, but decreased intake refusing more than a few bites stating "i'll do it later"  Recommend soft/bite sized diet and thin liquids.   Assist with meals.       GENERAL   Subjective: more drowsy today, RN reports got ativan overnight. mother at bedside.  Communication Observation: Aphasia  Patient Positioning: Partially reclined  Respiratory Status: Room air            Prior Dysphagia History: none at baseline     Current Diet : Easy to chew  Current Liquid Diet : Thin      Pain:   Patient does not c/o pain;Patient does not appear in pain                                             OBJECTIVE   Dysphagia:   Oral Motor   Labial: Decreased rate  Oral Hygiene: Clean  Lingual: Decreased rate  Patient consumed a few bites of bite sized sausage and muffin, and thin liquids via straw. No overt s/sx aspiration.   Patient needed explicit cues and tactile cues to initiate picking up fork. (Mother also reports patient is right handed but she had to cue patient to use right hand)  Slowed oral prep and delayed oral clearance.       Communication: patient seen for aphasia treatment.   Name: "i'm chillin", "i'm right here"  Name of daughter: patient fluently responding but not directly answering question  ID object in visual field of 2: 0/4  1 step commands: 2/8 (otherwise needed model)  Phrase completion: 1/5 ("tell me that again", "I don't know")  No improvement with verbal field of 2 or yes/no format    Yes/no questions: 1/4 (patient responded with connected speech but unrelated)  Yes/no questions(description/function): 4/6        PLAN    Duration/Frequency: Continue to follow patient 3x/week for duration of hospitalization and/or until goals met    Speech Therapy Prognosis  Prognosis: Good    Dysphagia Outcome and Severity Scale (DOSS)  Dysphagia Outcome Severity Scale: Level 4: Mild moderate dysphagia- Intermittent supervision/cueing. One - two diet consistencies restricted  Interpretation of Tool: The Dysphagia Outcome and Severity Scale (DOSS) is a simple, easy-to-use, 7-point scale developed to systematically rate the functional severity of dysphagia based on objective assessment and make recommendations for diet level, independence level, and type of nutrition.   Normal(7), Functional(6), Mild(5), Mild-Moderate(4), Moderate(3), Moderate-Severe(2), Severe(1)    MODIFIED RANKIN SCALE (mRS) SCORE: 3  Interpretation of Tool: The Modified Rankin Scale is a scale used to quantify level of disability as it relates to a patient's functional abilities.   No Symptoms(0); No significant disability despite  symptoms; able to carry out all usual duties and activities(1); Slight disability; unable to carry out all previous activities but able to look after own affairs without assistance(2); Moderate disability; requiring some help but able to walk without assistance(3); Moderately severe disability; unable to walk without assistance and unable to attend to own bodily needs without assistance(4); Severe disability; bedridden, incontinent, and requiring constant nursing care and attention(5)    Education: Patient, RN, Family member  Patient Education: aphasia, role of SLP, diet recommendation, strategies. mother verbalized understanding.  Patient Education Response: Needs reinforcement, No evidence of learning    PRECAUTIONS/ALLERGIES: Patient has no known allergies.      Safety Devices in place: Yes  Type of devices: Left in bed, Nurse notified    Therapy Time  SLP Individual Minutes  Time In: 0812  Time Out: 0840  Minutes: [address] [facility], [place]    Rutha Bouchard, [place]  11/02/2021 8:50 AM

## 2021-11-02 NOTE — Progress Notes (Signed)
Notified that pt's sbp is 145, will recheck in one hour and if remains above 140 will administer prn

## 2021-11-02 NOTE — Progress Notes (Signed)
Please complete and sign MRI screening form.If patient is claustrophobic please have medication ready.

## 2021-11-02 NOTE — Progress Notes (Signed)
Hospitalist Progress Note   Admit Date:  10/30/2021  1:38 PM   Name:  Timothy Larsen   Age:  31 y.o.  Sex:  male  DOB:  08/09/1990   MRN:  [id number]   Room:  701/01    Presenting/Chief Complaint: No chief complaint on file.     Reason(s) for Admission: Cerebral hemorrhage [facility]) [I61.9]     Hospital Course:   Timothy Larsen is a 31 y.o. male with medical history of alcohol dependence who [facility]. Timothy Larsen after being found by the police department roaming around knocking on doors intoxicated.  They brought him to the emergency department for further evaluation.  He initially presented poorly cooperative and confused which led to a CT of the head.  This showed a left frontal and temporal contusion with associated subarachnoid hemorrhage with minimal mass effect concerning for trauma to his head.  Neurosurgery was consulted by the emergency department who recommended admission to be ICU at [facility] downtown for neurology evaluation.     Patient was placed on Keppra.  No steroids.  Blood pressure on admission was rather stable with systolic readings in the 110s to 130 range and diastolic pressures ranging between 60 and 80.  He did present intoxicated with an ethanol level of 283, laboratory work essentially otherwise unremarkable. UDS negative. CTA of the neck has been ordered and is pending.     Patient is complaining of a headache pointing to his occipital bone where there is a palpable area of swelling.  He believes that he may have fallen but cannot provide a clear history.  He is minimally cooperative in the H&P and and physical exam.     I called his mother over the phone who states that he has no significant past medical history aside from heavy drinking.  She does not believe he has ever been through alcohol withdrawals before.  She states that in the past he had been seen by neurologist at one point and was given steroids but this was a while ago and she does not know what this was for.      Patient will be admitted to [facility] downtown, accepting MD is Dr. Mel Almond. Neurosurgery and Neurology has been made aware.    Repeat CT head unremarkable. Neurosurgery & neurology have weighed in.    Transferred from ICU. Neuro status remains unchanged to improving.    Neuro status unchanged. Aphasia persistent.    Subjective & 24hr Events:   Patient was assessed at bedside. No acute events overnight. He states he's "okay." Interview is otherwise limited. When asked if he has no complaints he says no.     ROS unable to accurately obtain 2/2 aphasia      Assessment & Plan:     Traumatic Cerebral parenchymal hemorrhage  Neurologic status remains unchanged from presentation, repeat CT head without interval change  CTA head & neck without source for intracranial hemorrhage & no intracranial aneurysms or vascular malformations idenitifed  Appreciate neurosurgery and neurology consultation  Continue on Keppra 500 mg twice daily, convert to PO x 7 days total  Continue PRN IV labetalol, maintain SBP <140   Add PO labetalol to reduce PRN requirements  Neurochecks every hour for 24 hours, then NIH every 4 hrs  Repeat CT head STAT for any neurologic worsening  Avoid DVT prophylaxis or other blood thinning agents  As needed analgesia and antiemetics  Discussing need for MRI +/- MRA brain with neurology  HTN  BP parameters as above - labetalol required as a PRN on two occasions  Continue to monitor    Excessive alcohol use   Continue to monitor for Ssx of W/D; CIWA   monitor and correct any electrolyte abnormalities.  Add PO thiamine    PT/OT evals and PPD needed/ordered?  Yes  Diet:  ADULT DIET; Easy to Chew  VTE prophylaxis:  holding, contraindicated  Code status: Full Code        Non-peripheral Lines and Tubes (if present):           Hospital Problems:  Principal Problem:    Cerebral parenchymal hemorrhage (H[facility])  Active Problems:    Cerebral hemorrhage ([facility])    Alcohol dependence ([facility])    Hypertension  Resolved  Problems:    * No resolved hospital problems. *      Objective:   Patient Vitals for the past 24 hrs:   Temp Pulse Resp BP SpO2   11/02/21 0400 98.1 F (36.7 C) 66 18 (!) 139/97 98 %   11/02/21 0231 -- -- 14 -- --   11/02/21 0201 -- -- 14 -- --   11/02/21 0157 -- 63 -- (!) 148/94 --   11/02/21 0132 -- 64 -- (!) 146/108 --   11/02/21 0043 -- 67 -- (!) 149/95 --   11/02/21 0000 99.4 F (37.4 C) 70 16 (!) 145/101 99 %   11/01/21 2000 98.7 F (37.1 C) 66 16 (!) 136/99 100 %   11/01/21 1600 97.5 F (36.4 C) 65 14 132/74 100 %   11/01/21 1200 98.4 F (36.9 C) 63 16 135/77 99 %   11/01/21 0800 98.6 F (37 C) 65 16 (!) 140/83 100 %       Oxygen Therapy  SpO2: 98 %  Pulse Oximetry Type: Continuous  Pulse via Oximetry: 73 beats per minute  Pulse Oximeter Device Mode: Continuous  Pulse Oximeter Device Location: Finger  O2 Device: None (Room air)    Estimated body mass index is 22.65 kg/m as calculated from the following:    Height as of this encounter: 5\' 9"  (1.753 m).    Weight as of this encounter: 153 lb 6.4 oz (69.6 kg).    Intake/Output Summary (Last 24 hours) at 11/02/2021 0705  Last data filed at 11/02/2021 0000  Gross per 24 hour   Intake 527 ml   Output --   Net 527 ml         Physical Exam:     General:    Well nourished.    Head:  Normocephalic, atraumatic  Eyes:  Sclerae appear normal.  Pupils equally round.  ENT:  Nares appear normal.  Moist oral mucosa  Neck:  No restricted ROM.  Trachea midline   CV:   RRR.  No m/r/g.  No jugular venous distension.  Lungs:   CTAB.  No wheezing, rhonchi, or rales.  Symmetric expansion.  Abdomen:   Soft, nontender, nondistended.  Extremities: No cyanosis or clubbing.  No edema  Skin:     No rashes and normal coloration.   Warm and dry.    Psych:  Normal mood and affect.    Neuro:  CN II-XII grossly intact.  Strength 5/5 bilateral UE and LE.  Sensation intact bilateral UE and LE. No drift.    I have personally reviewed labs and tests:  Recent Labs:  Recent Results (from the  past 48 hour(s))   CBC with Auto Differential    Collection Time: 11/01/21  4:33 AM   Result Value Ref Range    WBC 9.1 4.3 - 11.1 K/uL    RBC 4.41 4.23 - 5.6 M/uL    Hemoglobin 13.6 13.6 - 17.2 g/dL    Hematocrit 72.5 (L) 41.1 - 50.3 %    MCV 91.4 82 - 102 FL    MCH 30.8 26.1 - 32.9 PG    MCHC 33.7 31.4 - 35.0 g/dL    RDW 36.6 44.0 - 34.7 %    Platelets 196 150 - 450 K/uL    MPV 10.6 9.4 - 12.3 FL    nRBC 0.00 0.0 - 0.2 K/uL    Differential Type AUTOMATED      Neutrophils % 85 (H) 43 - 78 %    Lymphocytes % 8 (L) 13 - 44 %    Monocytes % 7 4.0 - 12.0 %    Eosinophils % 0 (L) 0.5 - 7.8 %    Basophils % 0 0.0 - 2.0 %    Immature Granulocytes 0 0.0 - 5.0 %    Neutrophils Absolute 7.6 1.7 - 8.2 K/UL    Lymphocytes Absolute 0.7 0.5 - 4.6 K/UL    Monocytes Absolute 0.7 0.1 - 1.3 K/UL    Eosinophils Absolute 0.0 0.0 - 0.8 K/UL    Basophils Absolute 0.0 0.0 - 0.2 K/UL    Absolute Immature Granulocyte 0.0 0.0 - 0.5 K/UL   Basic Metabolic Panel w/ Reflex to MG    Collection Time: 11/01/21  4:33 AM   Result Value Ref Range    Sodium 135 133 - 143 mmol/L    Potassium 3.6 3.5 - 5.1 mmol/L    Chloride 104 101 - 110 mmol/L    CO2 27 21 - 32 mmol/L    Anion Gap 4 2 - 11 mmol/L    Glucose 121 (H) 65 - 100 mg/dL    BUN 9 6 - 23 MG/DL    Creatinine 4.25 0.8 - 1.5 MG/DL    Est, Glom Filt Rate >60 >60 ml/min/1.57m2    Calcium 9.4 8.3 - 10.4 MG/DL   CBC with Auto Differential    Collection Time: 11/02/21  3:26 AM   Result Value Ref Range    WBC 8.2 4.3 - 11.1 K/uL    RBC 4.64 4.23 - 5.6 M/uL    Hemoglobin 14.1 13.6 - 17.2 g/dL    Hematocrit 95.6 38.7 - 50.3 %    MCV 91.6 82 - 102 FL    MCH 30.4 26.1 - 32.9 PG    MCHC 33.2 31.4 - 35.0 g/dL    RDW 56.4 33.2 - 95.1 %    Platelets 204 150 - 450 K/uL    MPV 10.3 9.4 - 12.3 FL    nRBC 0.00 0.0 - 0.2 K/uL    Differential Type AUTOMATED      Neutrophils % 80 (H) 43 - 78 %    Lymphocytes % 11 (L) 13 - 44 %    Monocytes % 9 4.0 - 12.0 %    Eosinophils % 0 (L) 0.5 - 7.8 %    Basophils % 0 0.0 -  2.0 %    Immature Granulocytes 0 0.0 - 5.0 %    Neutrophils Absolute 6.5 1.7 - 8.2 K/UL    Lymphocytes Absolute 0.9 0.5 - 4.6 K/UL    Monocytes Absolute 0.7 0.1 - 1.3 K/UL    Eosinophils Absolute 0.0 0.0 - 0.8 K/UL    Basophils Absolute 0.0 0.0 - 0.2 K/UL  Absolute Immature Granulocyte 0.0 0.0 - 0.5 K/UL   Basic Metabolic Panel w/ Reflex to MG    Collection Time: 11/02/21  3:26 AM   Result Value Ref Range    Sodium 138 133 - 143 mmol/L    Potassium 3.3 (L) 3.5 - 5.1 mmol/L    Chloride 102 101 - 110 mmol/L    CO2 29 21 - 32 mmol/L    Anion Gap 7 2 - 11 mmol/L    Glucose 141 (H) 65 - 100 mg/dL    BUN 10 6 - 23 MG/DL    Creatinine 5.63 0.8 - 1.5 MG/DL    Est, Glom Filt Rate >60 >60 ml/min/1.77m2    Calcium 9.1 8.3 - 10.4 MG/DL   Magnesium    Collection Time: 11/02/21  3:26 AM   Result Value Ref Range    Magnesium 2.3 1.8 - 2.4 mg/dL       Current Meds:  Current Facility-Administered Medications   Medication Dose Route Frequency    labetalol (NORMODYNE;TRANDATE) injection 10 mg  10 mg IntraVENous Q10 Min PRN    ondansetron (ZOFRAN) injection 4 mg  4 mg IntraVENous Q6H PRN    polyethylene glycol (GLYCOLAX) packet 17 g  17 g Oral BID PRN    naloxone (NARCAN) injection 0.4 mg  0.4 mg IntraVENous PRN    levETIRAcetam (KEPPRA) injection 500 mg  500 mg IntraVENous Q12H    oxyCODONE (ROXICODONE) immediate release tablet 5 mg  5 mg Oral Q6H PRN    traMADol (ULTRAM) tablet 50 mg  50 mg Oral Q6H PRN    sodium chloride flush 0.9 % injection 5-40 mL  5-40 mL IntraVENous PRN    sodium chloride flush 0.9 % injection 5-40 mL  5-40 mL IntraVENous 2 times per day    LORazepam (ATIVAN) tablet 1 mg  1 mg Oral Q1H PRN    Or    LORazepam (ATIVAN) injection 1 mg  1 mg IntraVENous Q1H PRN    Or    LORazepam (ATIVAN) tablet 2 mg  2 mg Oral Q1H PRN    Or    LORazepam (ATIVAN) injection 2 mg  2 mg IntraVENous Q1H PRN    Or    LORazepam (ATIVAN) tablet 3 mg  3 mg Oral Q1H PRN    Or    LORazepam (ATIVAN) injection 3 mg  3 mg IntraVENous Q1H PRN     Or    LORazepam (ATIVAN) tablet 4 mg  4 mg Oral Q1H PRN    Or    LORazepam (ATIVAN) injection 4 mg  4 mg IntraVENous Q1H PRN    bisacodyl (DULCOLAX) EC tablet 5 mg  5 mg Oral Daily PRN    0.9 % sodium chloride infusion   IntraVENous PRN    multivitamin 1 tablet  1 tablet Oral Daily    thiamine (B-1) injection 100 mg  100 mg IntraVENous Daily    sodium chloride flush 0.9 % injection 5-40 mL  5-40 mL IntraVENous PRN    sodium chloride flush 0.9 % injection 5-40 mL  5-40 mL IntraVENous 2 times per day    acetaminophen (TYLENOL) tablet 650 mg  650 mg Oral Q6H PRN    Or    acetaminophen (TYLENOL) suppository 650 mg  650 mg Rectal Q6H PRN    prochlorperazine (COMPAZINE) injection 5 mg  5 mg IntraVENous Q4H PRN       Signed:  Pura Spice, DO    Part of this note may have  been written by using a voice dictation software.  The note has been proof read but may still contain some grammatical/other typographical errors.

## 2021-11-02 NOTE — Progress Notes (Signed)
ACUTE OCCUPATIONAL THERAPY GOALS:   (Developed with and agreed upon by patient and/or caregiver.)  1. Patient will complete lower body bathing and dressing with INDEPENDENCE  2. Patient will complete toileting with INDEPENDENCE.   3. Patient will tolerate 30 minutes of OT treatment with 0 rest breaks to increase activity tolerance for ADLs.   4. Patient will complete functional transfers with INDEPENDENCE.  5. Patient will complete functional mobility for household distances with INDEPENDENCE  6. Patient will complete self-grooming while standing edge of sink with INDEPENDENCE.  7. Patient will complete functional tasks with no cues for facilitation from therapist 100% of therapy session.     Timeframe: 7 visits      OCCUPATIONAL THERAPY: Daily Note PM   OT Visit Days: 2   Time In/Out  OT Charge Capture  Rehab Caseload Tracker  OT Orders    Timothy Larsen is a 31 y.o. 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 Pt presents supine upon arrival. Able to perform bed mobility and transfers in room with supervision. Following commands well enough to perform grooming at the sink and completed shower with supervision. Did well with dressing with no loss of balance for LB. Making progress with goals. Will continue to benefit from skilled OT during stay.       SUBJECTIVE:     Timothy Larsen states, "I am not supposed to be like this"     Social/Functional ADL Assistance: Independent  Ambulation Assistance: Independent  Transfer Assistance: Independent  Occupation:  Unemployed    OBJECTIVE:     LINES / DRAINS / AIRWAY: NA    RESTRICTIONS/PRECAUTIONS:  Restrictions/Precautions  Restrictions/Precautions: Fall Risk        PAIN: VITALS / O2:   Pre Treatment:      0      Post Treatment: 0 Vitals          Oxygen        MOBILITY: I Mod I S SBA CGA Min Mod Max Total  NT x2 Comments:   Bed Mobility    Rolling _0  _1  _2  _3  _4  _5  _6  _7  _8  _9  _10     Supine to Sit _11  _12  _13  _14  _15  _16  _17  _18  _19  _20  _21     Scooting _22  _23  _24  _25  _26  _27  _28  _29  _30  _31  _32     Sit to Supine _33  _34  _35  _36  _37  _38  _39  _40  _41  _42  _43     Transfers    Sit to Stand _44  _45  _46  _47  _48  _49  _50  _51  _52  _53  _54     Bed to Chair _55  _56  _57  _58  _59  _60  _61  _62  _63  _64  _65     Stand to Sit _66  _67  _68  _69  _70  _71  _72  _73  _74  _75  _76     Tub/Shower _77  _78  _79  _80  _81  _82  _83  _84  _85  _86  _87      Toilet _88  _89  _90  _91  _92  _93  _94  _95  _96  _97  _98       _99  _100  _101  _102  _103  _104  _105  _106  _107  _108  _109   I=Independent, Mod I=Modified Independent, S=Supervision/Setup, SBA=Standby Assistance, CGA=Contact Guard Assistance, Min=Minimal Assistance, Mod=Moderate Assistance, Max=Maximal Assistance, Total=Total Assistance, NT=Not Tested    ACTIVITIES OF DAILY LIVING: I Mod I S SBA CGA Min Mod Max Total NT Comments   BASIC ADLs:              Upper Body   Bathing _0  _1  _2  _3  _4  _5  _6  _7  _8  _9     Lower Body Bathing _10  _11  _12  _13  _14  _15  _16  _17  _18  _19     Toileting _20  _21  _22  _23  _24  _25  _26  _27  _28  _29     Upper Body Dressing _30  _31  _32  _33  _34  _35  _36  _37  _38  _39     Lower Body Dressing _40  _41  _42  _43  _44  _45  _46  _47  _48  _49     Feeding _50  _51  _52  _53  _54  _55  _56  _57  _58  _59     Grooming _60  _61  _62  _63  _64  _65  _66  _67  _68  _69     Personal Device Care _70  _71  _72  _73  _74  _75  _76  _77  _78  _79     Functional Mobility _80  _81  _82  _83  _84  _85  _86  _87  _88  _89     I=Independent, Mod I=Modified Independent, S=Supervision/Setup, SBA=Standby Assistance, CGA=Contact Guard Assistance, Min=Minimal Assistance, Mod=Moderate Assistance, Max=Maximal Assistance, Total=Total Assistance, NT=Not Tested    BALANCE: Good Fair+ Fair Fair- Poor NT Comments   Sitting Static _90  _91  _92  _93  _94  _95     Sitting Dynamic _96  _97   _98  _99  _100  _101               Standing Static _102  _103  _104  _105  _106  _107     Standing Dynamic _108  _109  _110  _111  _112  _113         PLAN:     FREQUENCY/DURATION   OT Plan of Care: 3 times/week for duration of hospital stay or until stated goals are met, whichever comes first.    TREATMENT:     TREATMENT:   Therapeutic Activity (10 Minutes): Patient participated in therapeutic activities including bed mobility training, functional transfer training, shower transfer training, functional mobility of household distances, bathroom mobility, sitting tolerance activity, and standing tolerance activity with minimal verbal cues in order to increase independence, increase safety awareness, increase activity tolerance, increase coordination, and prepare for discharge home.   Self Care (30 minutes): Patient participated in upper body bathing, lower body bathing, toileting, upper body dressing, lower body dressing, and grooming ADLs in supported sitting and standing with minimal verbal cueing to increase independence, decrease assistance required, increase activity tolerance, and increase safety awareness. Patient also participated in functional mobility and functional transfer training to increase independence, decrease assistance required, increase activity tolerance, and increase safety awareness.     TREATMENT GRID:  N/A    AFTER TREATMENT PRECAUTIONS: Alarm Activated, Bed/Chair Locked, Call light within reach, Chair, Needs within reach, and RN notified    INTERDISCIPLINARY COLLABORATION:  RN/ PCT and OT/ COTA    EDUCATION:       TOTAL TREATMENT DURATION AND TIME:  Time In: 1430  Time Out: [place]  Minutes: Ranger Ericca Labra, OTA

## 2021-11-03 LAB — CBC WITH AUTO DIFFERENTIAL
Basophils %: 0 % (ref 0.0–2.0)
Basophils Absolute: 0 10*3/uL (ref 0.0–0.2)
Eosinophils %: 0 % — ABNORMAL LOW (ref 0.5–7.8)
Eosinophils Absolute: 0 10*3/uL (ref 0.0–0.8)
Hematocrit: 44.7 % (ref 41.1–50.3)
Hemoglobin: 14.7 g/dL (ref 13.6–17.2)
Immature Granulocytes Absolute: 0 10*3/uL (ref 0.0–0.5)
Immature Granulocytes: 0 % (ref 0.0–5.0)
Lymphocytes %: 20 % (ref 13–44)
Lymphocytes Absolute: 1.4 10*3/uL (ref 0.5–4.6)
MCH: 30.2 PG (ref 26.1–32.9)
MCHC: 32.9 g/dL (ref 31.4–35.0)
MCV: 91.8 FL (ref 82–102)
MPV: 10.3 FL (ref 9.4–12.3)
Monocytes %: 8 % (ref 4.0–12.0)
Monocytes Absolute: 0.6 10*3/uL (ref 0.1–1.3)
Neutrophils %: 72 % (ref 43–78)
Neutrophils Absolute: 5 10*3/uL (ref 1.7–8.2)
Platelets: 228 10*3/uL (ref 150–450)
RBC: 4.87 M/uL (ref 4.23–5.6)
RDW: 12.4 % (ref 11.9–14.6)
WBC: 7 10*3/uL (ref 4.3–11.1)
nRBC: 0 10*3/uL (ref 0.0–0.2)

## 2021-11-03 LAB — MAGNESIUM: Magnesium: 2.1 mg/dL (ref 1.8–2.4)

## 2021-11-03 LAB — BASIC METABOLIC PANEL W/ REFLEX TO MG FOR LOW K
Anion Gap: 8 mmol/L (ref 2–11)
BUN: 10 MG/DL (ref 6–23)
CO2: 25 mmol/L (ref 21–32)
Calcium: 9.9 MG/DL (ref 8.3–10.4)
Chloride: 105 mmol/L (ref 101–110)
Creatinine: 1 MG/DL (ref 0.8–1.5)
Est, Glom Filt Rate: >60 mL/min/{1.73_m2} (ref >60)
Glucose: 121 mg/dL — ABNORMAL HIGH (ref 65–100)
Potassium: 3.4 mmol/L — ABNORMAL LOW (ref 3.5–5.1)
Sodium: 138 mmol/L (ref 133–143)

## 2021-11-03 MED ORDER — LEVETIRACETAM 500 MG PO TABS
500 MG | ORAL_TABLET | Freq: Two times a day (BID) | ORAL | 0 refills | Status: AC
Start: 2021-11-03 — End: 2021-11-07

## 2021-11-03 MED ORDER — AMLODIPINE BESYLATE 5 MG PO TABS
5 MG | ORAL_TABLET | Freq: Every day | ORAL | 0 refills | Status: AC
Start: 2021-11-03 — End: ?

## 2021-11-03 MED ORDER — POTASSIUM CHLORIDE CRYS ER 20 MEQ PO TBCR
20 MEQ | Freq: Once | ORAL | Status: AC
Start: 2021-11-03 — End: 2021-11-03
  Administered 2021-11-03: 13:00:00 40 meq via ORAL

## 2021-11-03 MED ORDER — AMLODIPINE BESYLATE 5 MG PO TABS
5 MG | Freq: Every day | ORAL | Status: DC
Start: 2021-11-03 — End: 2021-11-03
  Administered 2021-11-03: 17:00:00 5 mg via ORAL

## 2021-11-03 MED FILL — POTASSIUM CHLORIDE CRYS ER 20 MEQ PO TBCR: 20 MEQ | ORAL | Qty: 2

## 2021-11-03 MED FILL — THIAMINE HCL 100 MG PO TABS: 100 MG | ORAL | Qty: 1

## 2021-11-03 MED FILL — ACETAMINOPHEN 325 MG PO TABS: 325 MG | ORAL | Qty: 2

## 2021-11-03 MED FILL — STRESS FORMULA PO TABS: ORAL | Qty: 1

## 2021-11-03 MED FILL — LEVETIRACETAM 500 MG PO TABS: 500 MG | ORAL | Qty: 1

## 2021-11-03 MED FILL — LABETALOL HCL 200 MG PO TABS: 200 MG | ORAL | Qty: 1

## 2021-11-03 MED FILL — AMLODIPINE BESYLATE 5 MG PO TABS: 5 MG | ORAL | Qty: 1

## 2021-11-03 NOTE — Progress Notes (Signed)
Patient discharged to home with mother with no additional services.  All belongings were sent with him and he was transported by care tech by wheelchair to a private vehicle.

## 2021-11-03 NOTE — Plan of Care (Signed)
Problem: Discharge Planning  Goal: Discharge to home or other facility with appropriate resources  11/03/2021 1440 by Vanessa Barbara, RN  Outcome: Adequate for Discharge  11/03/2021 1022 by Vanessa Barbara, RN  Outcome: Progressing  Flowsheets (Taken 11/03/2021 0800)  Discharge to home or other facility with appropriate resources: Identify barriers to discharge with patient and caregiver     Problem: Pain  Goal: Verbalizes/displays adequate comfort level or baseline comfort level  11/03/2021 1440 by Vanessa Barbara, RN  Outcome: Adequate for Discharge  11/03/2021 1022 by Vanessa Barbara, RN  Outcome: Progressing     Problem: Safety - Adult  Goal: Free from fall injury  11/03/2021 1440 by Vanessa Barbara, RN  Outcome: Adequate for Discharge  11/03/2021 1022 by Vanessa Barbara, RN  Outcome: Progressing     Problem: Neurosensory - Adult  Goal: Achieves stable or improved neurological status  11/03/2021 1440 by Vanessa Barbara, RN  Outcome: Adequate for Discharge  11/03/2021 1022 by Vanessa Barbara, RN  Outcome: Progressing  Flowsheets (Taken 11/03/2021 0800)  Achieves stable or improved neurological status: Assess for and report changes in neurological status  Goal: Absence of seizures  11/03/2021 1440 by Vanessa Barbara, RN  Outcome: Adequate for Discharge  11/03/2021 1022 by Vanessa Barbara, RN  Outcome: Progressing  Flowsheets (Taken 11/03/2021 0800)  Absence of seizures: Monitor for seizure activity.  If seizure occurs, document type and location of movements and any associated apnea  Goal: Remains free of injury related to seizures activity  11/03/2021 1440 by Vanessa Barbara, RN  Outcome: Adequate for Discharge  11/03/2021 1022 by Vanessa Barbara, RN  Outcome: Progressing  Flowsheets (Taken 11/03/2021 0800)  Remains free of injury related to seizure activity: Maintain airway, patient safety  and administer oxygen as  ordered  Goal: Achieves maximal functionality and self care  11/03/2021 1440 by Vanessa Barbara, RN  Outcome: Adequate for Discharge  11/03/2021 1022 by Vanessa Barbara, RN  Outcome: Progressing  Flowsheets (Taken 11/03/2021 0800)  Achieves maximal functionality and self care: Monitor swallowing and airway patency with patient fatigue and changes in neurological status     Problem: Musculoskeletal - Adult  Goal: Return mobility to safest level of function  11/03/2021 1440 by Vanessa Barbara, RN  Outcome: Adequate for Discharge  11/03/2021 1022 by Vanessa Barbara, RN  Outcome: Progressing  Flowsheets (Taken 11/03/2021 0800)  Return Mobility to Safest Level of Function: Assess patient stability and activity tolerance for standing, transferring and ambulating with or without assistive devices  Goal: Maintain proper alignment of affected body part  11/03/2021 1440 by Vanessa Barbara, RN  Outcome: Adequate for Discharge  11/03/2021 1022 by Vanessa Barbara, RN  Outcome: Progressing  Goal: Return ADL status to a safe level of function  11/03/2021 1440 by Vanessa Barbara, RN  Outcome: Adequate for Discharge  11/03/2021 1022 by Vanessa Barbara, RN  Outcome: Progressing     Problem: Discharge Planning  Goal: Discharge to home or other facility with appropriate resources  11/03/2021 1440 by Vanessa Barbara, RN  Outcome: Adequate for Discharge  11/03/2021 1022 by Vanessa Barbara, RN  Outcome: Progressing  Flowsheets (Taken 11/03/2021 0800)  Discharge to home or other facility with appropriate resources: Identify barriers to discharge with patient and caregiver     Problem: Pain  Goal: Verbalizes/displays adequate comfort level or baseline comfort level  11/03/2021 1440 by Vanessa Barbara, RN  Outcome: Adequate for Discharge  11/03/2021 1022 by Vanessa Barbara, RN  Outcome: Progressing     Problem: Safety - Adult  Goal:  Free from fall  injury  11/03/2021 1440 by Vanessa Barbara, RN  Outcome: Adequate for Discharge  11/03/2021 1022 by Vanessa Barbara, RN  Outcome: Progressing     Problem: Neurosensory - Adult  Goal: Achieves stable or improved neurological status  11/03/2021 1440 by Vanessa Barbara, RN  Outcome: Adequate for Discharge  11/03/2021 1022 by Vanessa Barbara, RN  Outcome: Progressing  Flowsheets (Taken 11/03/2021 0800)  Achieves stable or improved neurological status: Assess for and report changes in neurological status  Goal: Absence of seizures  11/03/2021 1440 by Vanessa Barbara, RN  Outcome: Adequate for Discharge  11/03/2021 1022 by Vanessa Barbara, RN  Outcome: Progressing  Flowsheets (Taken 11/03/2021 0800)  Absence of seizures: Monitor for seizure activity.  If seizure occurs, document type and location of movements and any associated apnea  Goal: Remains free of injury related to seizures activity  11/03/2021 1440 by Vanessa Barbara, RN  Outcome: Adequate for Discharge  11/03/2021 1022 by Vanessa Barbara, RN  Outcome: Progressing  Flowsheets (Taken 11/03/2021 0800)  Remains free of injury related to seizure activity: Maintain airway, patient safety  and administer oxygen as ordered  Goal: Achieves maximal functionality and self care  11/03/2021 1440 by Vanessa Barbara, RN  Outcome: Adequate for Discharge  11/03/2021 1022 by Vanessa Barbara, RN  Outcome: Progressing  Flowsheets (Taken 11/03/2021 0800)  Achieves maximal functionality and self care: Monitor swallowing and airway patency with patient fatigue and changes in neurological status     Problem: Musculoskeletal - Adult  Goal: Return mobility to safest level of function  11/03/2021 1440 by Vanessa Barbara, RN  Outcome: Adequate for Discharge  11/03/2021 1022 by Vanessa Barbara, RN  Outcome: Progressing  Flowsheets (Taken 11/03/2021 0800)  Return Mobility to Safest Level of Function:  Assess patient stability and activity tolerance for standing, transferring and ambulating with or without assistive devices  Goal: Maintain proper alignment of affected body part  11/03/2021 1440 by Vanessa Barbara, RN  Outcome: Adequate for Discharge  11/03/2021 1022 by Vanessa Barbara, RN  Outcome: Progressing  Goal: Return ADL status to a safe level of function  11/03/2021 1440 by Vanessa Barbara, RN  Outcome: Adequate for Discharge  11/03/2021 1022 by Vanessa Barbara, RN  Outcome: Progressing

## 2021-11-03 NOTE — Care Coordination-Inpatient (Signed)
Pt is medically cleared for dc to his mother's home today.  CM met with pt/mother to discuss dc needs.  Demographics confirmed.  CM updated pt's address to his mother's address and entered his phone number.  Pt's mother's contact information is correct.  Pt's mother lives in [place], [place]  CM instructed her to seek out a local PCP and establish care there.  She stated she would most likely take him to her PCP.  Pt can then be referred to a provider in that area for outpatient SLP.  [facility]) has spoken with the mom and will mail her information about applying for [facility] financial assistance.  No assistance needed for dc medications.  CM provided pt's mother with a letter for the pt's apartment complex in the hopes the pt will not incur penalties for breaking his lease since he will now be living with his mother.  No other dc needs or concerns identified or reported.       11/03/21 1210   Service Assessment   Patient Orientation Alert and Oriented   Cognition Alert   History Provided By Child/Family;Medical Record   Primary Caregiver Self   Support Systems Parent;Family Members   Patient's [place] is: Legal Next of [place]   PCP Verified by CM No   Prior Functional Level Independent in ADLs/IADLs   Current Functional Level Independent in ADLs/IADLs   Can patient return to prior living arrangement Yes   Ability to make needs known: Fair   Family able to assist with home care needs: Yes   Would you like for me to discuss the discharge plan with any other family members/significant others, and if so, who? Yes  Social research officer, government)   Solicitor None   Social/Functional History   Lives With Alone   Type of Home Apartment   ADL Assistance Independent   Ambulation Assistance Independent   Transfer Assistance Independent   Occupation Full time employment   Discharge Planning   Type of [place] Parent   Current Services  Prior To Admission None   Potential Assistance Needed N/A   DME Ordered? No   Potential Assistance Purchasing Medications No   Type of Home Care Services None   Patient expects to be discharged to: [place] Discharge   Transition of Care Consult (CM Consult) [place] Discharge None   [employer] Information Provided? No   Mode of Transport at Discharge Other (see comment)  (mother)   Confirm Follow Up Transport Family   Condition of Participation: Discharge Planning   The Plan for Transition of Care is related to the following treatment goals: DC to mother's home in [facility]- establish PCP and request referral to local provider for SLP   Freedom of Choice list was provided with basic dialogue that supports the patient's individualized plan of care/goals, treatment preferences, and shares the quality data associated with the providers?  No  (not required)

## 2021-11-03 NOTE — Discharge Instructions (Addendum)
Follow with Primary care doctor as soon as possible

## 2021-11-03 NOTE — Progress Notes (Signed)
ACUTE PHYSICAL THERAPY GOALS:   (Developed with and agreed upon by patient and/or caregiver.)  LTG:  (1.)Mr. Timothy Larsen will move from supine to sit and sit to supine , scoot up and down, and roll side to side in bed with INDEPENDENCE within 7 treatment day(s).    (2.)Mr. Timothy Larsen will transfer from bed to chair and chair to bed with INDEPENDENCE using the least restrictive device within 7 treatment day(s).    (3.)Mr. Timothy Larsen will ambulate with INDEPENDENCE for 500 feet with the least restrictive device within 7 treatment day(s).  (4.)Mr. Timothy Larsen will perform standing static and dynamic balance activities x 15 minutes with INDEPENDENCE to improve safety within 7 day(s).  (5.)Mr. Timothy Larsen will ascend and descend 10 stairs using R hand rail(s) with SUPERVISION to improve functional mobility and safety within 7 day(s).       PHYSICAL THERAPY: Daily Note AM   (Link to Caseload Tracking: PT Visit Days : 1  Time In/Out PT Charge Capture  Rehab Caseload Tracker  Orders    Timothy Larsen is a 31 y.o. male   PRIMARY DIAGNOSIS: Cerebral parenchymal hemorrhage ([place])  Cerebral hemorrhage ([place]) [I61.9]       Inpatient: Payor: /     ASSESSMENT:     REHAB RECOMMENDATIONS:   Recommendation to date pending progress:  Setting:  No further skilled physical therapy after discharge from hospital    Equipment:    None     ASSESSMENT:  Mr. Timothy Larsen with improved expressive speech today, able to articulate his PLOF and with improved participation during therapy.  He continues to be somewhat impulsive and requires cues for safety/navigation.  Physically, he requires no assistance and will likely be able to discharge home with assist from his mother.  No anticipated PT needs after discharge.     SUBJECTIVE:   Mr. Timothy Larsen states, "My head hurts."     Social/Functional ADL Assistance: Independent  Ambulation Assistance: Independent  Transfer Assistance: Independent  Occupation: Unemployed  OBJECTIVE:     PAIN: VITALS / O2: PRECAUTION / LINES / DRAINS:   Pre  Treatment:    Did not rate      Post Treatment: Did not rate Vitals        Oxygen    IV    RESTRICTIONS/PRECAUTIONS:        MOBILITY: I Mod I S SBA CGA Min Mod Max Total  NT x2 Comments:   Bed Mobility    Rolling []  []  [x]  []  []  []  []  []  []  []  []     Supine to Sit []  []  [x]  []  []  []  []  []  []  []  []     Scooting []  []  [x]  []  []  []  []  []  []  []  []     Sit to Supine []  []  [x]  []  []  []  []  []  []  []  []     Transfers    Sit to Stand []  []  [x]  []  []  []  []  []  []  []  []     Bed to Chair []  []  [x]  []  []  []  []  []  []  []  []     Stand to Sit []  []  [x]  []  []  []  []  []  []  []  []      []  []  []  []  []  []  []  []  []  []  []     I=Independent, Mod I=Modified Independent, S=Supervision, SBA=Standby Assistance, CGA=Contact Guard Assistance,   Min=Minimal Assistance, Mod=Moderate Assistance, Max=Maximal Assistance, Total=Total Assistance, NT=Not Tested    BALANCE: Good Fair+ Fair Fair- Poor NT Comments   Sitting Static [x]  []  []  []  []  []   Sitting Dynamic [x]  []  []  []  []  []               Standing Static [x]  []  []  []  []  []     Standing Dynamic []  [x]  []  []  []  []       GAIT: I Mod I S SBA CGA Min Mod Max Total  NT x2 Comments:   Level of Assistance []  []  [x]  []  []  []  []  []  []  []  []     Distance   feet    DME None    Gait Quality N/A    Weightbearing Status      Stairs      I=Independent, Mod I=Modified Independent, S=Supervision, SBA=Standby Assistance, CGA=Contact Guard Assistance,   Min=Minimal Assistance, Mod=Moderate Assistance, Max=Maximal Assistance, Total=Total Assistance, NT=Not Tested    PLAN:   FREQUENCY AND DURATION: 3 times/week for duration of hospital stay or until stated goals are met, whichever comes first.    TREATMENT:   TREATMENT:   Therapeutic Activity (15 Minutes): Therapeutic activity included Rolling, Supine to Sit, Sit to Supine, Scooting, Transfer Training, Ambulation on level ground, Sitting balance , Standing balance, and pt/family education to improve functional Activity tolerance, Balance, Coordination, Mobility, and safe  discharge.    TREATMENT GRID:  N/A    AFTER TREATMENT PRECAUTIONS: Alarm Activated, Bed, Bed/Chair Locked, Call light within reach, RN notified, and Visitors at bedside    INTERDISCIPLINARY COLLABORATION:  RN/ PCT and PT/ PTA    EDUCATION:      TIME IN/OUT:       Kathlene Cote, PT

## 2021-11-03 NOTE — Progress Notes (Signed)
Physician Progress Note      PATIENT:               Timothy Larsen, Timothy Larsen  CSN #:                  [id number]  DOB:                       [birth year]  ADMIT DATE:       10/30/2021 1:38 PM  DISCH DATE:        11/03/2021 2:36 PM  RESPONDING  PROVIDER #:        Emeterio Reeve MD          QUERY TEXT:    Pt admitted with [facility] .  Pt noted to have 7/17 CTH: This showed a left frontal   and temporal contusion with associated subarachnoid hemorrhage with minimal   mass effect concerning for trauma to his head.  If clinically significant,   please document in progress notes and discharge summary if you are   evaluating/treating any of the following:    The medical record reflects the following:  Risk Factors: 30 YOM, ETOH dependence,  Clinical Indicators: presented with poor cooperation, confusion, aphasia  7/17 Neurology consult:  The patient has both receptive and expressive aphasia   and is unable to provide an accurate history.  7/19 MRI Brain: Hemorrhagic contusions in the left frontal lobe and anterior   left temporal lobe.  Treatment: Monitoring, Neurosurgery and neurology consult, Keppra, PT/OT.   amlodipine for blood pressure control. dysphagia diet    Thank you,  Sherilyn Cooter RN,C BSN  Clinical Documentation Specialist  [email or web address]  Options provided:  -- Cerebral edema  -- Brain compression  -- Cerebral edema and Brain compression  -- Traumatic brain compression  -- Other - I will add my own diagnosis  -- Disagree - Not applicable / Not valid  -- Disagree - Clinically unable to determine / Unknown  -- Refer to Clinical Documentation Reviewer    PROVIDER RESPONSE TEXT:    Brain minimal mass effect    Query created by: Marcelle Smiling on 11/07/2021 11:28 AM      Electronically signed by:  Emeterio Reeve MD 11/07/2021 1:48 PM

## 2021-11-03 NOTE — Plan of Care (Signed)
Problem: Discharge Planning  Goal: Discharge to home or other facility with appropriate resources  Outcome: Progressing  Flowsheets (Taken 11/03/2021 0800)  Discharge to home or other facility with appropriate resources: Identify barriers to discharge with patient and caregiver     Problem: Pain  Goal: Verbalizes/displays adequate comfort level or baseline comfort level  Outcome: Progressing     Problem: Safety - Adult  Goal: Free from fall injury  Outcome: Progressing     Problem: Neurosensory - Adult  Goal: Achieves stable or improved neurological status  Outcome: Progressing  Flowsheets (Taken 11/03/2021 0800)  Achieves stable or improved neurological status: Assess for and report changes in neurological status  Goal: Absence of seizures  Outcome: Progressing  Flowsheets (Taken 11/03/2021 0800)  Absence of seizures: Monitor for seizure activity.  If seizure occurs, document type and location of movements and any associated apnea  Goal: Remains free of injury related to seizures activity  Outcome: Progressing  Flowsheets (Taken 11/03/2021 0800)  Remains free of injury related to seizure activity: Maintain airway, patient safety  and administer oxygen as ordered  Goal: Achieves maximal functionality and self care  Outcome: Progressing  Flowsheets (Taken 11/03/2021 0800)  Achieves maximal functionality and self care: Monitor swallowing and airway patency with patient fatigue and changes in neurological status     Problem: Musculoskeletal - Adult  Goal: Return mobility to safest level of function  Outcome: Progressing  Flowsheets (Taken 11/03/2021 0800)  Return Mobility to Safest Level of Function: Assess patient stability and activity tolerance for standing, transferring and ambulating with or without assistive devices  Goal: Maintain proper alignment of affected body part  Outcome: Progressing  Goal: Return ADL status to a safe level of function  Outcome: Progressing

## 2021-11-03 NOTE — Progress Notes (Signed)
GOALS: LTG: Patient will increase receptive/expressive language skills demonstrated by the ability to communicate basic wants/needs across environments.   STG: Patient will answer yes/no questions with 80% accuracy given minimal cueing  STG: Patient will answer questions related to daily events/biographical information/environment via yes/no with 80% accuracy given minimal cueing  STG: Patient will follow 1-step commands with moderate cueing  STG: Patient will identify item named in field of 3 with 90% accuracy given minimal cueing  STG: Patient will identify item in visual field of 2 given description with 80% accuracy given minimal cueing  STG: Patient will repeat basic phrases with 90% accuracy given minimal cueing  STG: Patient will complete sentences with 90% accuracy given minimal cueing  STG: Patient will name common objects with 80% accuracy given minimal cueing  STG: Patient will name function of object with 90% accuracy given minimal cueing  SPEECH LANGUAGE PATHOLOGY: COMMUNICATION Daily Note #3    MRN: [id number]    ADMISSION DATE: 10/30/2021  PRIMARY DIAGNOSIS: Cerebral parenchymal hemorrhage (HCC)  Cerebral hemorrhage ([place]) [I61.9]    ICD-10: Treatment Diagnosis: 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 Recommend ongoing speech therapy services during this hospitalization and next level of care        ASSESSMENT         Patient presents with aphasia impacting functional expressive and receptive communication. He has significantly decreased comprehension resulting in vague verbal responses that are not typically appropriate to context and difficulty following 1-step commands unless highly contextual environment.   He benefits from model.     Recommend ongoing speech therapy to address functional communication. Discussed recommendations  with patient's mother.         GENERAL   Subjective: awake in bed with mother present. keeps eyes closed unless given visual task. reportedly discharing home today.       Communication Observation: Aphasia  Follows Directions: None (needs model)  Respiratory Status: Room air               Prior Dysphagia History: none       Pain:   Patient c/o pain (head-RN notified)                                          OBJECTIVE   Patient seen for aphasia-  Able to state his name and "hospital"    ID picture in visual field of 2: 3/10   Matching pictures: 4/7 (omitted 3, 7/7 with prompt)    Automatic speech:  -Opposites: 4/6 (6/6 with repetition and increased time)  -Phrase completion: 1/5 (perseveration)       Spontaneous utterances "I need something" "they've got to have something"     PLAN    Duration/Frequency: Continue to follow patient 3x/week for duration of hospitalization and/or until goals met    Speech Therapy Prognosis  Prognosis: Good    MODIFIED RANKIN SCALE (mRS) SCORE: 3  Interpretation of Tool: The Modified Rankin Scale is a scale used to quantify level of disability as it relates to a patient's functional abilities.   No Symptoms(0); No significant disability despite symptoms; able to carry out all usual duties and activities(1); Slight disability; unable to carry out all previous activities but able to look  after own affairs without assistance(2); Moderate disability; requiring some help but able to walk without assistance(3); Moderately severe disability; unable to walk without assistance and unable to attend to own bodily needs without assistance(4); Severe disability; bedridden, incontinent, and requiring constant nursing care and attention(5)      Education: Patient, RN, Family member  Patient Education: aphasia, role of SLP, diet recommendation, strategies. mother verbalized understanding.  Patient Education Response: Needs reinforcement, No evidence of learning    PRECAUTIONS/ALLERGIES: Patient has no  known allergies.     Safety Devices in place: Yes  Type of devices: Left in bed, Call light within reach, Nurse notified    Therapy Time  SLP Individual Minutes  Time In: 1320  Time Out: [place]  Minutes: [address] [place]  11/03/2021 2:16 PM

## 2021-11-03 NOTE — Discharge Summary (Signed)
Hospitalist Discharge Summary   Admit Date:  10/30/2021  1:38 PM   DC Note date: 11/03/2021  Name:  Timothy Larsen   Age:  31 y.o.  Sex:  male  DOB:  1990-04-25   MRN:  [id number]   Room:  701/01  PCP:  No primary care provider on file.    Presenting Complaint: No chief complaint on file.     Initial Admission Diagnosis: Cerebral hemorrhage (HCC) [I61.9]     Problem List for this Hospitalization (present on admission):    Principal Problem:    Cerebral parenchymal hemorrhage (HCC)  Active Problems:    Cerebral hemorrhage (HCC)    Alcohol dependence (HCC)    Hypertension  Resolved Problems:    * No resolved hospital problems. *      Hospital Course:  31 y.o. male with medical history of alcohol dependence who presented to [facility]. [name] after being found by the police department roaming around knocking on doors intoxicated.  They brought him to the emergency department for further evaluation.  He initially presented poorly cooperative and confused which led to a CT of the head.  This showed a left frontal and temporal contusion with associated subarachnoid hemorrhage with minimal mass effect concerning for trauma to his head.  Neurosurgery was consulted and no surgical intervention recommended.  He was started on Keppra.  Improved symptomatology.  PT and OT recommended home health therapy.  Started ON amlodipine for blood pressure control.  Outpatient follow-up.    Disposition: Home with Home Health  Diet: ADULT DIET; Dysphagia - Soft and Bite Sized  Code Status: Full Code    Follow Ups:      Time spent in patient discharge and coordination 31 minutes.        Follow up labs/diagnostics (ultimately defer to outpatient provider):  None    Plan was discussed with patient, mother.  All questions answered.  Patient was stable at time of discharge.  Instructions given to call a physician or return if any concerns.    Current Discharge Medication List        START taking these medications    Details   levETIRAcetam  (KEPPRA) 500 MG tablet Take 1 tablet by mouth 2 times daily for 4 days  Qty: 8 tablet, Refills: 0      amLODIPine (NORVASC) 5 MG tablet Take 1 tablet by mouth daily  Qty: 30 tablet, Refills: 0             Some medications may have been reported old/obsolete and marked "stop taking" by the system; in reality pt was already off these meds; defer to outpatient or prescribing providers.    Procedures done this admission:  * No surgery found *    Consults this admission:  IP CONSULT TO NEUROSURGERY  IP CONSULT TO NEUROLOGY  IP CONSULT TO SOCIAL WORK    Echocardiogram results:  No results found for this or any previous visit.      Diagnostic Imaging/Tests:   CT HEAD WO CONTRAST    Result Date: 10/31/2021  No significant interval change in left frontotemporal hemorrhagic contusions. Redemonstration of subarachnoid hemorrhage along the left frontotemporal convexity but this has decreased in conspicuity in the interim. No new hemorrhage. No midline shift or hydrocephalus.   Rudolpho Sevin, M.D. 10/31/2021 3:38:00 AM    CT HEAD WO CONTRAST    Result Date: 10/30/2021  1.  Several large parenchymal hemorrhages and subarachnoid hemorrhage involving the left frontal and temporal lobes. 2.  No midline shift. Intraparenchymal and subarachnoid hemorrhage this finding was discussed with the Dr. Katrina Stack via telephone at 10/30/2021 10:09 AM.     XR CHEST 1 VIEW    Result Date: 10/30/2021  No acute cardiopulmonary abnormality.     CTA HEAD NECK W CONTRAST    Result Date: 10/30/2021  No acute cerebrovascular occlusive disease. No intracranial aneurysms or vascular malformations are identified. A source for intracranial hemorrhage is not identified.     MRI BRAIN W WO CONTRAST    Result Date: 11/02/2021  Hemorrhagic contusions in the left frontal lobe and anterior left temporal lobe.           Labs: Results:       BMP, Mg, Phos Recent Labs     11/01/21  0433 11/02/21  0326 11/03/21  0441   NA 135 138 138   K 3.6 3.3* 3.4*   CL 104 102 105    CO2 27 29 25    ANIONGAP 4 7 8    BUN 9 10 10    CREATININE 0.80 1.00 1.00   LABGLOM >60 >60 >60   CALCIUM 9.4 9.1 9.9   GLUCOSE 121* 141* 121*   MG  --  2.3 2.1      CBC @LABRCNT (WBC:3,RBC:3,HGB:3,HCT:3,MCV:3,MCH:3,MCHC:3,RDW:3,PLT:3,MPV:3,NRBC:3,SEGS:3,LYMPHOPCT:3,EOSRELPCT:3,MONOPCT:3,BASOPCT:3,IMMGRAN:3,SEGSABS:3,LYMPHSABS:3,EOSABS:3,MONOSABS:3,BASOSABS:3,ABSIMMGRAN:3)@   LFT No results for input(s): BILITOT, BILIDIR, ALKPHOS, AST, ALT, PROT, LABALBU, GLOB in the last 72 hours.   Cardiac  No results found for: NTPROBNP, TROPHS   Coags No results found for: PROTIME, INR, APTT   A1c No results found for: LABA1C, EAG   Lipids No results found for: CHOL, LDLCALC, LABVLDL, HDL, CHOLHDLRATIO, TRIG   Thyroid  Lab Results   Component Value Date/Time    TSHELE 0.30 10/30/2021 07:50 AM        Most Recent UA Lab Results   Component Value Date/Time    COLORU YELLOW/STRAW 10/30/2021 11:33 AM    APPEARANCE CLEAR 10/30/2021 11:33 AM    SPECGRAV 1.020 10/30/2021 11:33 AM    LABPH 6.0 10/30/2021 11:33 AM    PROTEINU Negative 10/30/2021 11:33 AM    GLUCOSEU Negative 10/30/2021 11:33 AM    KETUA 15 10/30/2021 11:33 AM    BILIRUBINUR Negative 10/30/2021 11:33 AM    BLOODU Negative 10/30/2021 11:33 AM    UROBILINOGEN 0.2 10/30/2021 11:33 AM    NITRU Negative 10/30/2021 11:33 AM    LEUKOCYTESUR Negative 10/30/2021 11:33 AM        Microbiology:  Results       ** No results found for the last 336 hours. **            All Labs from Last 24 Hrs:  Recent Results (from the past 24 hour(s))   CBC with Auto Differential    Collection Time: 11/03/21  4:41 AM   Result Value Ref Range    WBC 7.0 4.3 - 11.1 K/uL    RBC 4.87 4.23 - 5.6 M/uL    Hemoglobin 14.7 13.6 - 17.2 g/dL    Hematocrit 11/01/2021 11/01/2021 - 50.3 %    MCV 91.8 82 - 102 FL    MCH 30.2 26.1 - 32.9 PG    MCHC 32.9 31.4 - 35.0 g/dL    RDW 11/01/2021 11/05/21 - 50.0 %    Platelets 228 150 - 450 K/uL    MPV 10.3 9.4 - 12.3 FL    nRBC 0.00 0.0 - 0.2 K/uL    Differential Type AUTOMATED      Neutrophils %  72 43 -  78 %    Lymphocytes % 20 13 - 44 %    Monocytes % 8 4.0 - 12.0 %    Eosinophils % 0 (L) 0.5 - 7.8 %    Basophils % 0 0.0 - 2.0 %    Immature Granulocytes 0 0.0 - 5.0 %    Neutrophils Absolute 5.0 1.7 - 8.2 K/UL    Lymphocytes Absolute 1.4 0.5 - 4.6 K/UL    Monocytes Absolute 0.6 0.1 - 1.3 K/UL    Eosinophils Absolute 0.0 0.0 - 0.8 K/UL    Basophils Absolute 0.0 0.0 - 0.2 K/UL    Absolute Immature Granulocyte 0.0 0.0 - 0.5 K/UL   Basic Metabolic Panel w/ Reflex to MG    Collection Time: 11/03/21  4:41 AM   Result Value Ref Range    Sodium 138 133 - 143 mmol/L    Potassium 3.4 (L) 3.5 - 5.1 mmol/L    Chloride 105 101 - 110 mmol/L    CO2 25 21 - 32 mmol/L    Anion Gap 8 2 - 11 mmol/L    Glucose 121 (H) 65 - 100 mg/dL    BUN 10 6 - 23 MG/DL    Creatinine 1.61 0.8 - 1.5 MG/DL    Est, Glom Filt Rate >60 >60 ml/min/1.94m2    Calcium 9.9 8.3 - 10.4 MG/DL   Magnesium    Collection Time: 11/03/21  4:41 AM   Result Value Ref Range    Magnesium 2.1 1.8 - 2.4 mg/dL       No Known Allergies    There is no immunization history on file for this patient.    Recent Vital Data:  Patient Vitals for the past 24 hrs:   Temp Pulse Resp BP SpO2   11/03/21 1314 -- -- -- (!) 138/101 --   11/03/21 1200 98.1 F (36.7 C) 68 19 (!) 138/101 99 %   11/03/21 0852 -- 66 -- (!) 148/98 --   11/03/21 0800 98.4 F (36.9 C) 66 19 (!) 148/98 100 %   11/03/21 0400 98.6 F (37 C) 67 17 124/87 99 %   11/03/21 0258 -- 63 -- (!) 136/91 100 %   11/03/21 0000 99 F (37.2 C) 75 17 126/74 98 %   11/02/21 2133 -- 71 -- (!) 137/94 --   11/02/21 2000 98.2 F (36.8 C) 68 18 (!) 143/96 98 %   11/02/21 1600 98.6 F (37 C) 69 16 129/81 --   11/02/21 1534 -- -- -- -- 100 %       Oxygen Therapy  SpO2: 99 %  Pulse Oximetry Type: Continuous  Pulse via Oximetry: 73 beats per minute  Pulse Oximeter Device Mode: Continuous  Pulse Oximeter Device Location: Finger  O2 Device: None (Room air)    Estimated body mass index is 22.65 kg/m as calculated from the  following:    Height as of this encounter: 5\' 9"  (1.753 m).    Weight as of this encounter: 153 lb 6.4 oz (69.6 kg).    Intake/Output Summary (Last 24 hours) at 11/03/2021 1428  Last data filed at 11/02/2021 1753  Gross per 24 hour   Intake 240 ml   Output --   Net 240 ml         Physical Exam:    General:    Well nourished.  No overt distress  Head:  Normocephalic, atraumatic  Eyes:  Sclerae appear normal.  Pupils equally round.    HENT:  Nares appear  normal, no drainage.  Moist mucous membranes  Neck:  No restricted ROM.  Trachea midline  CV:   RRR.  No m/r/g.  No JVD  Lungs:   CTAB.  No wheezing, rhonchi, or rales.  Respirations even, unlabored  Abdomen:   Soft, nontender, nondistended.    Extremities: Warm and dry.  No cyanosis or clubbing.  No edema.    Skin:     No rashes.  Normal coloration  Neuro:  CN II-XII grossly intact.  Mildly confused.  Psych:  Normal mood and affect.    Signed:  Emeterio Reeve, MD    Part of this note may have been written by using a voice dictation software.  The note has been proof read but may still contain some grammatical/other typographical errors.
